# Patient Record
Sex: Female | Born: 1935 | Race: White | Hispanic: No | Smoking: Former smoker
Health system: Southern US, Community
[De-identification: ages and names within clinical notes are randomized; demographics above are authoritative.]

## PROBLEM LIST (undated history)

## (undated) DIAGNOSIS — M109 Gout, unspecified: Secondary | ICD-10-CM

## (undated) DIAGNOSIS — E785 Hyperlipidemia, unspecified: Secondary | ICD-10-CM

## (undated) DIAGNOSIS — I10 Essential (primary) hypertension: Secondary | ICD-10-CM

## (undated) DIAGNOSIS — R739 Hyperglycemia, unspecified: Secondary | ICD-10-CM

## (undated) HISTORY — DX: Essential (primary) hypertension: I10

## (undated) HISTORY — DX: Hyperlipidemia, unspecified: E78.5

## (undated) HISTORY — PX: TONSILLECTOMY AND ADENOIDECTOMY: SUR1326

## (undated) HISTORY — DX: Gout, unspecified: M10.9

## (undated) HISTORY — DX: Hyperglycemia, unspecified: R73.9

---

## 1976-09-29 HISTORY — PX: APPENDECTOMY: SHX54

## 1976-09-29 HISTORY — PX: TOTAL ABDOMINAL HYSTERECTOMY: SHX209

## 1994-09-29 HISTORY — PX: COLECTOMY: SHX59

## 2009-09-17 HISTORY — PX: COLONOSCOPY: SHX174

## 2011-02-28 DIAGNOSIS — I10 Essential (primary) hypertension: Secondary | ICD-10-CM | POA: Insufficient documentation

## 2011-02-28 DIAGNOSIS — M109 Gout, unspecified: Secondary | ICD-10-CM | POA: Insufficient documentation

## 2011-02-28 DIAGNOSIS — E7800 Pure hypercholesterolemia, unspecified: Secondary | ICD-10-CM | POA: Insufficient documentation

## 2011-07-14 DIAGNOSIS — E079 Disorder of thyroid, unspecified: Secondary | ICD-10-CM | POA: Insufficient documentation

## 2011-07-14 DIAGNOSIS — C19 Malignant neoplasm of rectosigmoid junction: Secondary | ICD-10-CM | POA: Insufficient documentation

## 2011-08-04 HISTORY — PX: COLONOSCOPY: SHX174

## 2013-02-01 HISTORY — PX: KNEE SURGERY: SHX244

## 2014-11-07 DIAGNOSIS — J209 Acute bronchitis, unspecified: Secondary | ICD-10-CM | POA: Diagnosis not present

## 2014-12-02 DIAGNOSIS — H81399 Other peripheral vertigo, unspecified ear: Secondary | ICD-10-CM | POA: Diagnosis not present

## 2014-12-25 DIAGNOSIS — E782 Mixed hyperlipidemia: Secondary | ICD-10-CM | POA: Diagnosis not present

## 2014-12-25 DIAGNOSIS — E780 Pure hypercholesterolemia: Secondary | ICD-10-CM | POA: Diagnosis not present

## 2014-12-25 DIAGNOSIS — I10 Essential (primary) hypertension: Secondary | ICD-10-CM | POA: Diagnosis not present

## 2014-12-25 DIAGNOSIS — E039 Hypothyroidism, unspecified: Secondary | ICD-10-CM | POA: Diagnosis not present

## 2015-01-01 DIAGNOSIS — I10 Essential (primary) hypertension: Secondary | ICD-10-CM | POA: Diagnosis not present

## 2015-01-01 DIAGNOSIS — Z1389 Encounter for screening for other disorder: Secondary | ICD-10-CM | POA: Diagnosis not present

## 2015-01-01 DIAGNOSIS — R7301 Impaired fasting glucose: Secondary | ICD-10-CM | POA: Diagnosis not present

## 2015-01-01 DIAGNOSIS — N189 Chronic kidney disease, unspecified: Secondary | ICD-10-CM | POA: Diagnosis not present

## 2015-01-01 DIAGNOSIS — E039 Hypothyroidism, unspecified: Secondary | ICD-10-CM | POA: Diagnosis not present

## 2015-01-01 DIAGNOSIS — E782 Mixed hyperlipidemia: Secondary | ICD-10-CM | POA: Diagnosis not present

## 2015-04-20 DIAGNOSIS — N3000 Acute cystitis without hematuria: Secondary | ICD-10-CM | POA: Diagnosis not present

## 2015-05-02 DIAGNOSIS — R300 Dysuria: Secondary | ICD-10-CM | POA: Diagnosis not present

## 2015-06-15 DIAGNOSIS — J209 Acute bronchitis, unspecified: Secondary | ICD-10-CM | POA: Diagnosis not present

## 2015-06-20 DIAGNOSIS — N3000 Acute cystitis without hematuria: Secondary | ICD-10-CM | POA: Diagnosis not present

## 2015-06-26 DIAGNOSIS — E785 Hyperlipidemia, unspecified: Secondary | ICD-10-CM | POA: Diagnosis not present

## 2015-06-26 DIAGNOSIS — E780 Pure hypercholesterolemia: Secondary | ICD-10-CM | POA: Diagnosis not present

## 2015-06-26 DIAGNOSIS — I10 Essential (primary) hypertension: Secondary | ICD-10-CM | POA: Diagnosis not present

## 2015-06-26 DIAGNOSIS — E039 Hypothyroidism, unspecified: Secondary | ICD-10-CM | POA: Diagnosis not present

## 2015-06-27 DIAGNOSIS — Z961 Presence of intraocular lens: Secondary | ICD-10-CM | POA: Diagnosis not present

## 2015-07-03 DIAGNOSIS — I10 Essential (primary) hypertension: Secondary | ICD-10-CM | POA: Diagnosis not present

## 2015-07-03 DIAGNOSIS — R7301 Impaired fasting glucose: Secondary | ICD-10-CM | POA: Diagnosis not present

## 2015-07-03 DIAGNOSIS — E782 Mixed hyperlipidemia: Secondary | ICD-10-CM | POA: Diagnosis not present

## 2015-07-03 DIAGNOSIS — N189 Chronic kidney disease, unspecified: Secondary | ICD-10-CM | POA: Diagnosis not present

## 2015-07-03 DIAGNOSIS — Z23 Encounter for immunization: Secondary | ICD-10-CM | POA: Diagnosis not present

## 2015-07-03 DIAGNOSIS — E039 Hypothyroidism, unspecified: Secondary | ICD-10-CM | POA: Diagnosis not present

## 2015-08-04 DIAGNOSIS — M25572 Pain in left ankle and joints of left foot: Secondary | ICD-10-CM | POA: Diagnosis not present

## 2015-08-04 DIAGNOSIS — J019 Acute sinusitis, unspecified: Secondary | ICD-10-CM | POA: Diagnosis not present

## 2015-08-04 DIAGNOSIS — R609 Edema, unspecified: Secondary | ICD-10-CM | POA: Diagnosis not present

## 2015-09-05 DIAGNOSIS — R300 Dysuria: Secondary | ICD-10-CM | POA: Diagnosis not present

## 2015-12-04 DIAGNOSIS — N3001 Acute cystitis with hematuria: Secondary | ICD-10-CM | POA: Diagnosis not present

## 2015-12-04 DIAGNOSIS — R05 Cough: Secondary | ICD-10-CM | POA: Diagnosis not present

## 2015-12-25 DIAGNOSIS — E782 Mixed hyperlipidemia: Secondary | ICD-10-CM | POA: Diagnosis not present

## 2015-12-25 DIAGNOSIS — E039 Hypothyroidism, unspecified: Secondary | ICD-10-CM | POA: Diagnosis not present

## 2015-12-25 DIAGNOSIS — I10 Essential (primary) hypertension: Secondary | ICD-10-CM | POA: Diagnosis not present

## 2015-12-25 DIAGNOSIS — M1990 Unspecified osteoarthritis, unspecified site: Secondary | ICD-10-CM | POA: Diagnosis not present

## 2015-12-25 DIAGNOSIS — N189 Chronic kidney disease, unspecified: Secondary | ICD-10-CM | POA: Diagnosis not present

## 2015-12-25 DIAGNOSIS — R7301 Impaired fasting glucose: Secondary | ICD-10-CM | POA: Diagnosis not present

## 2016-01-01 DIAGNOSIS — R7301 Impaired fasting glucose: Secondary | ICD-10-CM | POA: Diagnosis not present

## 2016-01-01 DIAGNOSIS — I10 Essential (primary) hypertension: Secondary | ICD-10-CM | POA: Diagnosis not present

## 2016-01-01 DIAGNOSIS — E782 Mixed hyperlipidemia: Secondary | ICD-10-CM | POA: Diagnosis not present

## 2016-01-01 DIAGNOSIS — E039 Hypothyroidism, unspecified: Secondary | ICD-10-CM | POA: Diagnosis not present

## 2016-01-01 DIAGNOSIS — N189 Chronic kidney disease, unspecified: Secondary | ICD-10-CM | POA: Diagnosis not present

## 2016-01-07 DIAGNOSIS — N3090 Cystitis, unspecified without hematuria: Secondary | ICD-10-CM | POA: Insufficient documentation

## 2016-01-08 DIAGNOSIS — L820 Inflamed seborrheic keratosis: Secondary | ICD-10-CM | POA: Diagnosis not present

## 2016-01-08 DIAGNOSIS — N3090 Cystitis, unspecified without hematuria: Secondary | ICD-10-CM | POA: Diagnosis not present

## 2016-01-08 DIAGNOSIS — D485 Neoplasm of uncertain behavior of skin: Secondary | ICD-10-CM | POA: Diagnosis not present

## 2016-01-08 DIAGNOSIS — L821 Other seborrheic keratosis: Secondary | ICD-10-CM | POA: Diagnosis not present

## 2016-03-29 DIAGNOSIS — J0101 Acute recurrent maxillary sinusitis: Secondary | ICD-10-CM | POA: Diagnosis not present

## 2016-03-29 DIAGNOSIS — S46811A Strain of other muscles, fascia and tendons at shoulder and upper arm level, right arm, initial encounter: Secondary | ICD-10-CM | POA: Diagnosis not present

## 2016-03-29 DIAGNOSIS — J301 Allergic rhinitis due to pollen: Secondary | ICD-10-CM | POA: Diagnosis not present

## 2016-07-10 DIAGNOSIS — E782 Mixed hyperlipidemia: Secondary | ICD-10-CM | POA: Diagnosis not present

## 2016-07-10 DIAGNOSIS — N189 Chronic kidney disease, unspecified: Secondary | ICD-10-CM | POA: Diagnosis not present

## 2016-07-10 DIAGNOSIS — M1990 Unspecified osteoarthritis, unspecified site: Secondary | ICD-10-CM | POA: Diagnosis not present

## 2016-07-10 DIAGNOSIS — E039 Hypothyroidism, unspecified: Secondary | ICD-10-CM | POA: Diagnosis not present

## 2016-07-10 DIAGNOSIS — I10 Essential (primary) hypertension: Secondary | ICD-10-CM | POA: Diagnosis not present

## 2016-07-15 DIAGNOSIS — I10 Essential (primary) hypertension: Secondary | ICD-10-CM | POA: Diagnosis not present

## 2016-07-15 DIAGNOSIS — R7301 Impaired fasting glucose: Secondary | ICD-10-CM | POA: Diagnosis not present

## 2016-07-15 DIAGNOSIS — Z23 Encounter for immunization: Secondary | ICD-10-CM | POA: Diagnosis not present

## 2016-07-15 DIAGNOSIS — E039 Hypothyroidism, unspecified: Secondary | ICD-10-CM | POA: Diagnosis not present

## 2016-07-15 DIAGNOSIS — E782 Mixed hyperlipidemia: Secondary | ICD-10-CM | POA: Diagnosis not present

## 2016-07-15 DIAGNOSIS — N189 Chronic kidney disease, unspecified: Secondary | ICD-10-CM | POA: Diagnosis not present

## 2016-10-13 DIAGNOSIS — H524 Presbyopia: Secondary | ICD-10-CM | POA: Diagnosis not present

## 2016-10-13 DIAGNOSIS — H43393 Other vitreous opacities, bilateral: Secondary | ICD-10-CM | POA: Diagnosis not present

## 2017-01-08 DIAGNOSIS — E782 Mixed hyperlipidemia: Secondary | ICD-10-CM | POA: Diagnosis not present

## 2017-01-08 DIAGNOSIS — E7800 Pure hypercholesterolemia, unspecified: Secondary | ICD-10-CM | POA: Diagnosis not present

## 2017-01-08 DIAGNOSIS — N189 Chronic kidney disease, unspecified: Secondary | ICD-10-CM | POA: Diagnosis not present

## 2017-01-08 DIAGNOSIS — I10 Essential (primary) hypertension: Secondary | ICD-10-CM | POA: Diagnosis not present

## 2017-01-08 DIAGNOSIS — E039 Hypothyroidism, unspecified: Secondary | ICD-10-CM | POA: Diagnosis not present

## 2017-01-15 DIAGNOSIS — R7301 Impaired fasting glucose: Secondary | ICD-10-CM | POA: Diagnosis not present

## 2017-01-15 DIAGNOSIS — Z0001 Encounter for general adult medical examination with abnormal findings: Secondary | ICD-10-CM | POA: Diagnosis not present

## 2017-04-20 DIAGNOSIS — R300 Dysuria: Secondary | ICD-10-CM | POA: Diagnosis not present

## 2017-04-20 DIAGNOSIS — R109 Unspecified abdominal pain: Secondary | ICD-10-CM | POA: Diagnosis not present

## 2017-04-20 DIAGNOSIS — R350 Frequency of micturition: Secondary | ICD-10-CM | POA: Diagnosis not present

## 2017-04-25 DIAGNOSIS — R300 Dysuria: Secondary | ICD-10-CM | POA: Diagnosis not present

## 2017-05-26 ENCOUNTER — Ambulatory Visit (INDEPENDENT_AMBULATORY_CARE_PROVIDER_SITE_OTHER): Payer: Medicare Other | Admitting: Urology

## 2017-05-26 ENCOUNTER — Other Ambulatory Visit (HOSPITAL_COMMUNITY)
Admission: RE | Admit: 2017-05-26 | Discharge: 2017-05-26 | Disposition: A | Discharge status: Home / Self Care | Payer: Medicare Other | Source: Other Acute Inpatient Hospital | Attending: Urology | Admitting: Urology

## 2017-05-26 DIAGNOSIS — R8271 Bacteriuria: Secondary | ICD-10-CM

## 2017-05-26 DIAGNOSIS — N952 Postmenopausal atrophic vaginitis: Secondary | ICD-10-CM | POA: Diagnosis not present

## 2017-05-26 DIAGNOSIS — N3000 Acute cystitis without hematuria: Secondary | ICD-10-CM | POA: Diagnosis not present

## 2017-05-27 LAB — URINE CULTURE

## 2017-06-30 ENCOUNTER — Ambulatory Visit (INDEPENDENT_AMBULATORY_CARE_PROVIDER_SITE_OTHER): Payer: Medicare Other | Admitting: Urology

## 2017-06-30 DIAGNOSIS — R3915 Urgency of urination: Secondary | ICD-10-CM | POA: Diagnosis not present

## 2017-06-30 DIAGNOSIS — N3000 Acute cystitis without hematuria: Secondary | ICD-10-CM

## 2017-06-30 DIAGNOSIS — R8271 Bacteriuria: Secondary | ICD-10-CM | POA: Diagnosis not present

## 2017-06-30 DIAGNOSIS — N952 Postmenopausal atrophic vaginitis: Secondary | ICD-10-CM | POA: Diagnosis not present

## 2017-07-08 DIAGNOSIS — E782 Mixed hyperlipidemia: Secondary | ICD-10-CM | POA: Diagnosis not present

## 2017-07-08 DIAGNOSIS — E039 Hypothyroidism, unspecified: Secondary | ICD-10-CM | POA: Diagnosis not present

## 2017-07-08 DIAGNOSIS — E7800 Pure hypercholesterolemia, unspecified: Secondary | ICD-10-CM | POA: Diagnosis not present

## 2017-07-08 DIAGNOSIS — I10 Essential (primary) hypertension: Secondary | ICD-10-CM | POA: Diagnosis not present

## 2017-07-08 DIAGNOSIS — D485 Neoplasm of uncertain behavior of skin: Secondary | ICD-10-CM | POA: Diagnosis not present

## 2017-07-16 DIAGNOSIS — R7301 Impaired fasting glucose: Secondary | ICD-10-CM | POA: Diagnosis not present

## 2017-07-16 DIAGNOSIS — Z23 Encounter for immunization: Secondary | ICD-10-CM | POA: Diagnosis not present

## 2017-07-16 DIAGNOSIS — I10 Essential (primary) hypertension: Secondary | ICD-10-CM | POA: Diagnosis not present

## 2017-07-16 DIAGNOSIS — E782 Mixed hyperlipidemia: Secondary | ICD-10-CM | POA: Diagnosis not present

## 2017-07-16 DIAGNOSIS — R350 Frequency of micturition: Secondary | ICD-10-CM | POA: Diagnosis not present

## 2017-07-16 DIAGNOSIS — N189 Chronic kidney disease, unspecified: Secondary | ICD-10-CM | POA: Diagnosis not present

## 2017-07-16 DIAGNOSIS — E039 Hypothyroidism, unspecified: Secondary | ICD-10-CM | POA: Diagnosis not present

## 2017-08-05 DIAGNOSIS — E7800 Pure hypercholesterolemia, unspecified: Secondary | ICD-10-CM | POA: Diagnosis not present

## 2017-08-05 DIAGNOSIS — I10 Essential (primary) hypertension: Secondary | ICD-10-CM | POA: Diagnosis not present

## 2017-08-05 DIAGNOSIS — E039 Hypothyroidism, unspecified: Secondary | ICD-10-CM | POA: Diagnosis not present

## 2017-08-05 DIAGNOSIS — I4892 Unspecified atrial flutter: Secondary | ICD-10-CM | POA: Diagnosis not present

## 2017-08-05 DIAGNOSIS — I4510 Unspecified right bundle-branch block: Secondary | ICD-10-CM | POA: Diagnosis not present

## 2017-08-05 DIAGNOSIS — Z66 Do not resuscitate: Secondary | ICD-10-CM | POA: Diagnosis not present

## 2017-08-05 DIAGNOSIS — R0789 Other chest pain: Secondary | ICD-10-CM | POA: Diagnosis not present

## 2017-08-05 DIAGNOSIS — M109 Gout, unspecified: Secondary | ICD-10-CM | POA: Diagnosis not present

## 2017-08-05 DIAGNOSIS — Z87891 Personal history of nicotine dependence: Secondary | ICD-10-CM | POA: Diagnosis not present

## 2017-08-05 DIAGNOSIS — Z79899 Other long term (current) drug therapy: Secondary | ICD-10-CM | POA: Diagnosis not present

## 2017-08-05 DIAGNOSIS — Z780 Asymptomatic menopausal state: Secondary | ICD-10-CM | POA: Diagnosis not present

## 2017-08-05 DIAGNOSIS — E119 Type 2 diabetes mellitus without complications: Secondary | ICD-10-CM | POA: Diagnosis not present

## 2017-08-05 DIAGNOSIS — R0602 Shortness of breath: Secondary | ICD-10-CM | POA: Diagnosis not present

## 2017-08-05 DIAGNOSIS — Z85038 Personal history of other malignant neoplasm of large intestine: Secondary | ICD-10-CM | POA: Diagnosis not present

## 2017-08-05 DIAGNOSIS — N3289 Other specified disorders of bladder: Secondary | ICD-10-CM | POA: Diagnosis not present

## 2017-08-05 DIAGNOSIS — R0600 Dyspnea, unspecified: Secondary | ICD-10-CM | POA: Diagnosis not present

## 2017-08-05 DIAGNOSIS — R05 Cough: Secondary | ICD-10-CM | POA: Diagnosis not present

## 2017-08-05 DIAGNOSIS — R079 Chest pain, unspecified: Secondary | ICD-10-CM | POA: Diagnosis not present

## 2017-08-06 DIAGNOSIS — E039 Hypothyroidism, unspecified: Secondary | ICD-10-CM | POA: Diagnosis not present

## 2017-08-06 DIAGNOSIS — I10 Essential (primary) hypertension: Secondary | ICD-10-CM | POA: Diagnosis not present

## 2017-08-06 DIAGNOSIS — E7800 Pure hypercholesterolemia, unspecified: Secondary | ICD-10-CM | POA: Diagnosis not present

## 2017-08-06 DIAGNOSIS — R079 Chest pain, unspecified: Secondary | ICD-10-CM | POA: Diagnosis not present

## 2017-08-25 ENCOUNTER — Other Ambulatory Visit: Payer: Self-pay

## 2017-08-25 ENCOUNTER — Encounter: Payer: Self-pay | Admitting: *Deleted

## 2017-08-25 ENCOUNTER — Encounter: Payer: Self-pay | Admitting: Cardiovascular Disease

## 2017-08-25 ENCOUNTER — Ambulatory Visit: Payer: Medicare Other | Admitting: Cardiovascular Disease

## 2017-08-25 ENCOUNTER — Telehealth: Payer: Self-pay | Admitting: Cardiovascular Disease

## 2017-08-25 VITALS — BP 170/90 | HR 101 | Ht 62.0 in | Wt 161.0 lb

## 2017-08-25 DIAGNOSIS — R079 Chest pain, unspecified: Secondary | ICD-10-CM | POA: Diagnosis not present

## 2017-08-25 DIAGNOSIS — I10 Essential (primary) hypertension: Secondary | ICD-10-CM

## 2017-08-25 DIAGNOSIS — Z9289 Personal history of other medical treatment: Secondary | ICD-10-CM | POA: Diagnosis not present

## 2017-08-25 DIAGNOSIS — E785 Hyperlipidemia, unspecified: Secondary | ICD-10-CM

## 2017-08-25 DIAGNOSIS — R5383 Other fatigue: Secondary | ICD-10-CM

## 2017-08-25 NOTE — Telephone Encounter (Signed)
Pre-cert Verification for the following procedure    lexiscan myoview scheduled for 08-27-17  [facility]

## 2017-08-25 NOTE — Progress Notes (Signed)
CARDIOLOGY CONSULT NOTE  Patient ID: Courtney Oliver MRN: [id number] DOB/AGE: 1927-02-12 83 y.o.  Admit date: (Not on file) Primary Physician: Manon Hilding, MD Referring Physician: Dr. Quintin Alto  Reason for Consultation: Chest pain  HPI: Courtney Oliver is a 83 y.o. female who is being seen today for the evaluation of chest pain at the request of Sasser, Silvestre Moment, MD.   Past medical history includes hypertension, hyperlipidemia, and gout.  Echocardiogram performed on 08/06/17 at an outside facility was reportedly a technically difficult study but demonstrated normal left ventricular systolic function, LVEF 32-35%, and no significant valvular disease.  Lipids 07/08/17: Total cholesterol 140, triglycerides 207, HDL 44, LDL 55.  Additional labs include BUN 22, creatinine 0.99, sodium 139, potassium 4.4, TSH 2.75, uric acid 5.2.  ECG performed today which I personally interpreted demonstrated sinus rhythm with a right bundle branch block, 99 bpm.  She is here with her daughter-in-law.  She was apparently hospitalized for chest pain at [facility] on November 7.  I do not have a copy of the discharge summary but will request further documentation.  She said she was awoken by chest pain which got better as she sat up.  She was also short of breath.  She denies orthopnea and paroxysmal nocturnal dyspnea.  She has occasional left ankle swelling.  She currently denies exertional chest pain.  Her daughter-in-law notes that her mother-in-law has had increasing levels of fatigue.  When they were recently when shopping, she was sitting Oliver more frequently than she had been in the past 6-12 months.  She did not take her blood pressure medication today and it is markedly elevated, 170/90.  She said her systolic blood pressures are usually normal.  Social history: She is originally from [place] and lived there for 47 years.  She moved to [place] about 11 years ago to be close to  family.        No Known Allergies  Current Outpatient Medications  Medication Sig Dispense Refill  . allopurinol (ZYLOPRIM) 300 MG tablet     . atorvastatin (LIPITOR) 40 MG tablet 20 mg.     . losartan-hydrochlorothiazide (HYZAAR) 100-25 MG tablet losartan 100 mg-hydrochlorothiazide 25 mg tablet    . nitroGLYCERIN (NITROSTAT) 0.4 MG SL tablet     . oxybutynin (DITROPAN-XL) 10 MG 24 hr tablet Take 10 mg by mouth at bedtime.     No current facility-administered medications for this visit.     Past Medical History:  Diagnosis Date  . Gout   . Hyperglycemia   . Hyperlipidemia   . Hypertension     Past Surgical History:  Procedure Laterality Date  . APPENDECTOMY  1978  . COLECTOMY  1996  . COLONOSCOPY  09/17/2009   adenomatous polyp  . COLONOSCOPY  08/04/2011   tubular adenoma  . KNEE SURGERY  02/01/2013  . TONSILLECTOMY AND ADENOIDECTOMY    . TOTAL ABDOMINAL HYSTERECTOMY  1978    Social History   Socioeconomic History  . Marital status: Widowed    Spouse name: Not on file  . Number of children: Not on file  . Years of education: Not on file  . Highest education level: Not on file  Social Needs  . Financial resource strain: Not on file  . Food insecurity - worry: Not on file  . Food insecurity - inability: Not on file  . Transportation needs - medical: Not on file  . Transportation needs - non-medical: Not  on file  Occupational History  . Not on file  Tobacco Use  . Smoking status: Former Smoker    Types: Cigarettes  . Smokeless tobacco: Never Used  Substance and Sexual Activity  . Alcohol use: Not on file  . Drug use: Not on file  . Sexual activity: Not on file  Other Topics Concern  . Not on file  Social History Narrative  . Not on file     No family history of premature CAD in 1st degree relatives.  Current Meds  Medication Sig  . allopurinol (ZYLOPRIM) 300 MG tablet   . atorvastatin (LIPITOR) 40 MG tablet 20 mg.   .  losartan-hydrochlorothiazide (HYZAAR) 100-25 MG tablet losartan 100 mg-hydrochlorothiazide 25 mg tablet  . nitroGLYCERIN (NITROSTAT) 0.4 MG SL tablet   . oxybutynin (DITROPAN-XL) 10 MG 24 hr tablet Take 10 mg by mouth at bedtime.      Review of systems complete and found to be negative unless listed above in HPI    Physical exam Blood pressure (!) 170/90, pulse (!) 101, height 5\' 2"  (1.575 m), weight 161 lb (73 kg), SpO2 96 %. General: NAD Neck: No JVD, no thyromegaly or thyroid nodule.  Lungs: Clear to auscultation bilaterally with normal respiratory effort. CV: Nondisplaced PMI. Regular rate and rhythm, normal S1/split S2, no S3/S4, no murmur.  No peripheral edema.  No carotid bruit.    Abdomen: Soft, nontender, no distention.  Skin: Intact without lesions or rashes.  Neurologic: Alert and oriented x 3.  Psych: Normal affect. Extremities: No clubbing or cyanosis.  HEENT: Normal.   ECG: Most recent ECG reviewed.   Labs: No results found for: K, BUN, CREATININE, ALT, TSH, HGB   Lipids: No results found for: LDLCALC, LDLDIRECT, CHOL, TRIG, HDL      ASSESSMENT AND PLAN:  1.  Chest pain and fatigue: Risk factors for ischemic heart disease include age, hypertension, and hyperlipidemia. I will proceed with a nuclear myocardial perfusion imaging study to evaluate for ischemic heart disease (Lexiscan Myoview).  2.  Hypertension: Elevated today.  This will need additional monitoring to see if further antihypertensive titration as indicated.  3.  Hyperlipidemia: Lipids reviewed above.  Continue Lipitor 40 mg.   Disposition: Follow up in 6 weeks  Signed: Kate Sable, M.D., F.A.C.C.  08/25/2017, 8:43 AM

## 2017-08-25 NOTE — Patient Instructions (Signed)
Medication Instructions:  Continue all current medications.  Labwork: none  Testing/Procedures:  Your physician has requested that you have a lexiscan myoview. For further information please visit [email or web address]. Please follow instruction sheet, as given.  Office will contact with results via phone or letter.    Follow-Up: 6 weeks   Any Other Special Instructions Will Be Listed Below (If Applicable).  If you need a refill on your cardiac medications before your next appointment, please call your pharmacy.  

## 2017-08-28 ENCOUNTER — Encounter (HOSPITAL_COMMUNITY)
Admission: RE | Admit: 2017-08-28 | Discharge: 2017-08-28 | Disposition: A | Discharge status: Home / Self Care | Payer: Medicare Other | Source: Ambulatory Visit | Attending: Cardiovascular Disease | Admitting: Cardiovascular Disease

## 2017-08-28 ENCOUNTER — Encounter (HOSPITAL_COMMUNITY): Payer: Self-pay

## 2017-08-28 ENCOUNTER — Encounter (HOSPITAL_BASED_OUTPATIENT_CLINIC_OR_DEPARTMENT_OTHER)
Admission: RE | Admit: 2017-08-28 | Discharge: 2017-08-28 | Disposition: A | Discharge status: Home / Self Care | Payer: Medicare Other | Source: Ambulatory Visit | Attending: Cardiovascular Disease | Admitting: Cardiovascular Disease

## 2017-08-28 DIAGNOSIS — R079 Chest pain, unspecified: Secondary | ICD-10-CM | POA: Diagnosis not present

## 2017-08-28 LAB — NM MYOCAR MULTI W/SPECT W/WALL MOTION / EF
CHL CUP NUCLEAR SDS: 1
CHL CUP NUCLEAR SRS: 0
CHL CUP NUCLEAR SSS: 1
CHL CUP RESTING HR STRESS: 101 {beats}/min
LHR: 0.39
LV dias vol: 33 mL (ref 46–106)
LV sys vol: 3 mL
NUC STRESS TID: 0.87
Peak HR: 129 {beats}/min

## 2017-08-28 MED ORDER — TECHNETIUM TC 99M TETROFOSMIN IV KIT
30.0000 | PACK | Freq: Once | INTRAVENOUS | Status: AC | PRN
  Administered 2017-08-28: 30 via INTRAVENOUS

## 2017-08-28 MED ORDER — SODIUM CHLORIDE 0.9% FLUSH
INTRAVENOUS | Status: AC
Start: 2017-08-28 — End: 2017-08-28
  Administered 2017-08-28: 10 mL via INTRAVENOUS
  Filled 2017-08-28: qty 10

## 2017-08-28 MED ORDER — TECHNETIUM TC 99M TETROFOSMIN IV KIT
10.0000 | PACK | Freq: Once | INTRAVENOUS | Status: AC | PRN
  Administered 2017-08-28: 10 via INTRAVENOUS

## 2017-08-28 MED ORDER — REGADENOSON 0.4 MG/5ML IV SOLN
INTRAVENOUS | Status: AC
  Administered 2017-08-28: 0.4 mg via INTRAVENOUS
  Filled 2017-08-28: qty 5

## 2017-09-01 ENCOUNTER — Encounter: Payer: Self-pay | Admitting: *Deleted

## 2017-09-05 DIAGNOSIS — I4510 Unspecified right bundle-branch block: Secondary | ICD-10-CM | POA: Diagnosis not present

## 2017-09-05 DIAGNOSIS — E782 Mixed hyperlipidemia: Secondary | ICD-10-CM | POA: Diagnosis not present

## 2017-09-05 DIAGNOSIS — R072 Precordial pain: Secondary | ICD-10-CM | POA: Diagnosis not present

## 2017-09-05 DIAGNOSIS — I10 Essential (primary) hypertension: Secondary | ICD-10-CM | POA: Diagnosis not present

## 2017-09-09 ENCOUNTER — Telehealth: Payer: Self-pay | Admitting: Cardiovascular Disease

## 2017-09-09 NOTE — Telephone Encounter (Signed)
Notes recorded by Laurine Blazer, LPN on 51/0/2585 at 2:77 PM EST Patient notified of results by letter. Copy to pmd.   ------  Notes recorded by Herminio Commons, MD on 08/28/2017 at 4:06 PM EST Normal.    Patient notified of above.

## 2017-09-09 NOTE — Telephone Encounter (Signed)
Patient is calling requesting test results of recent stress test.  Please call [phone].

## 2017-09-15 ENCOUNTER — Ambulatory Visit: Payer: Medicare Other | Admitting: Urology

## 2017-09-15 DIAGNOSIS — N3281 Overactive bladder: Secondary | ICD-10-CM

## 2017-09-15 DIAGNOSIS — R3915 Urgency of urination: Secondary | ICD-10-CM

## 2017-09-15 DIAGNOSIS — R351 Nocturia: Secondary | ICD-10-CM | POA: Diagnosis not present

## 2017-10-07 ENCOUNTER — Encounter: Payer: Self-pay | Admitting: Cardiovascular Disease

## 2017-10-07 ENCOUNTER — Ambulatory Visit: Payer: Medicare Other | Admitting: Cardiovascular Disease

## 2017-10-07 VITALS — BP 140/78 | HR 106 | Ht 62.0 in | Wt 162.0 lb

## 2017-10-07 DIAGNOSIS — E785 Hyperlipidemia, unspecified: Secondary | ICD-10-CM

## 2017-10-07 DIAGNOSIS — J209 Acute bronchitis, unspecified: Secondary | ICD-10-CM | POA: Diagnosis not present

## 2017-10-07 DIAGNOSIS — R5383 Other fatigue: Secondary | ICD-10-CM | POA: Diagnosis not present

## 2017-10-07 DIAGNOSIS — I10 Essential (primary) hypertension: Secondary | ICD-10-CM | POA: Diagnosis not present

## 2017-10-07 DIAGNOSIS — R079 Chest pain, unspecified: Secondary | ICD-10-CM | POA: Diagnosis not present

## 2017-10-07 NOTE — Patient Instructions (Signed)
Medication Instructions:  Your physician recommends that you continue on your current medications as directed. Please refer to the Current Medication list given to you today.  Labwork: NONE  Testing/Procedures: NONE  Follow-Up: Your physician recommends that you schedule a follow-up appointment AS NEEDED WITH DR. KONESWARAN  Any Other Special Instructions Will Be Listed Below (If Applicable).  If you need a refill on your cardiac medications before your next appointment, please call your pharmacy. 

## 2017-10-07 NOTE — Progress Notes (Signed)
SUBJECTIVE: The patient returns for follow-up after undergoing cardiovascular testing performed for the evaluation of chest pain.  Nuclear stress test 08/28/17 was normal with no evidence of myocardial ischemia or scar, LVEF 92%.  Since her last visit with me she had one episode of chest pain alleviated with nitroglycerin.  It occurred while she was sleeping.  She denies any exertional symptoms of chest pain or shortness of breath.  She has been feeling well overall.   Review of Systems: As per "subjective", otherwise negative.  No Known Allergies  Current Outpatient Medications  Medication Sig Dispense Refill  . allopurinol (ZYLOPRIM) 300 MG tablet     . atorvastatin (LIPITOR) 40 MG tablet 20 mg.     . losartan-hydrochlorothiazide (HYZAAR) 100-25 MG tablet losartan 100 mg-hydrochlorothiazide 25 mg tablet    . nitroGLYCERIN (NITROSTAT) 0.4 MG SL tablet      No current facility-administered medications for this visit.     Past Medical History:  Diagnosis Date  . Gout   . Hyperglycemia   . Hyperlipidemia   . Hypertension     Past Surgical History:  Procedure Laterality Date  . APPENDECTOMY  1978  . COLECTOMY  1996  . COLONOSCOPY  09/17/2009   adenomatous polyp  . COLONOSCOPY  08/04/2011   tubular adenoma  . KNEE SURGERY  02/01/2013  . TONSILLECTOMY AND ADENOIDECTOMY    . TOTAL ABDOMINAL HYSTERECTOMY  1978    Social History   Socioeconomic History  . Marital status: Widowed    Spouse name: Not on file  . Number of children: Not on file  . Years of education: Not on file  . Highest education level: Not on file  Social Needs  . Financial resource strain: Not on file  . Food insecurity - worry: Not on file  . Food insecurity - inability: Not on file  . Transportation needs - medical: Not on file  . Transportation needs - non-medical: Not on file  Occupational History  . Not on file  Tobacco Use  . Smoking status: Former Smoker    Types: Cigarettes  .  Smokeless tobacco: Never Used  Substance and Sexual Activity  . Alcohol use: Not on file  . Drug use: Not on file  . Sexual activity: Not on file  Other Topics Concern  . Not on file  Social History Narrative  . Not on file     Vitals:   10/07/17 1029  BP: 140/78  Pulse: (!) 106  SpO2: 98%  Weight: 162 lb (73.5 kg)  Height: 5\' 2"  (1.575 m)    Wt Readings from Last 3 Encounters:  10/07/17 162 lb (73.5 kg)  08/25/17 161 lb (73 kg)     PHYSICAL EXAM General: NAD HEENT: Normal. Neck: No JVD, no thyromegaly. Lungs: Clear to auscultation bilaterally with normal respiratory effort. CV: Initially mildly tachycardic but regular, HR subsequently normalized, normal S1/S2, no S3/S4, no murmur. No pretibial or periankle edema.   Abdomen: Soft, nontender, no distention.  Neurologic: Alert and oriented.  Psych: Normal affect. Skin: Normal. Musculoskeletal: No gross deformities.    ECG: Most recent ECG reviewed.   Labs: No results found for: K, BUN, CREATININE, ALT, TSH, HGB   Lipids: No results found for: LDLCALC, LDLDIRECT, CHOL, TRIG, HDL     ASSESSMENT AND PLAN: 1.  Chest pain and fatigue: Nuclear stress test was normal as detailed above.  No further cardiac testing is indicated at this time.  If symptoms were to recur  I told her to inform me.  2.  Hypertension: Mildly elevated today.  This will need additional monitoring to see if further antihypertensive titration as indicated.  3.  Hyperlipidemia: Lipids previously reviewed.  Continue Lipitor 40 mg.      Disposition: Follow up as needed   Kate Sable, M.D., F.A.C.C.

## 2017-10-14 DIAGNOSIS — H524 Presbyopia: Secondary | ICD-10-CM | POA: Diagnosis not present

## 2017-10-14 DIAGNOSIS — H43813 Vitreous degeneration, bilateral: Secondary | ICD-10-CM | POA: Diagnosis not present

## 2017-10-20 DIAGNOSIS — N3001 Acute cystitis with hematuria: Secondary | ICD-10-CM | POA: Diagnosis not present

## 2017-11-16 DIAGNOSIS — R300 Dysuria: Secondary | ICD-10-CM | POA: Diagnosis not present

## 2018-01-14 DIAGNOSIS — E7800 Pure hypercholesterolemia, unspecified: Secondary | ICD-10-CM | POA: Diagnosis not present

## 2018-01-14 DIAGNOSIS — E039 Hypothyroidism, unspecified: Secondary | ICD-10-CM | POA: Diagnosis not present

## 2018-01-14 DIAGNOSIS — R7301 Impaired fasting glucose: Secondary | ICD-10-CM | POA: Diagnosis not present

## 2018-01-14 DIAGNOSIS — E782 Mixed hyperlipidemia: Secondary | ICD-10-CM | POA: Diagnosis not present

## 2018-01-14 DIAGNOSIS — I10 Essential (primary) hypertension: Secondary | ICD-10-CM | POA: Diagnosis not present

## 2018-01-20 DIAGNOSIS — Z1331 Encounter for screening for depression: Secondary | ICD-10-CM | POA: Diagnosis not present

## 2018-01-20 DIAGNOSIS — E782 Mixed hyperlipidemia: Secondary | ICD-10-CM | POA: Diagnosis not present

## 2018-01-20 DIAGNOSIS — Z1389 Encounter for screening for other disorder: Secondary | ICD-10-CM | POA: Diagnosis not present

## 2018-01-20 DIAGNOSIS — E039 Hypothyroidism, unspecified: Secondary | ICD-10-CM | POA: Diagnosis not present

## 2018-01-20 DIAGNOSIS — R7301 Impaired fasting glucose: Secondary | ICD-10-CM | POA: Diagnosis not present

## 2018-01-20 DIAGNOSIS — Z0001 Encounter for general adult medical examination with abnormal findings: Secondary | ICD-10-CM | POA: Diagnosis not present

## 2018-04-19 DIAGNOSIS — J301 Allergic rhinitis due to pollen: Secondary | ICD-10-CM | POA: Diagnosis not present

## 2018-04-19 DIAGNOSIS — N3000 Acute cystitis without hematuria: Secondary | ICD-10-CM | POA: Diagnosis not present

## 2018-07-20 DIAGNOSIS — R739 Hyperglycemia, unspecified: Secondary | ICD-10-CM | POA: Diagnosis not present

## 2018-07-20 DIAGNOSIS — E039 Hypothyroidism, unspecified: Secondary | ICD-10-CM | POA: Diagnosis not present

## 2018-07-20 DIAGNOSIS — E7800 Pure hypercholesterolemia, unspecified: Secondary | ICD-10-CM | POA: Diagnosis not present

## 2018-07-20 DIAGNOSIS — I10 Essential (primary) hypertension: Secondary | ICD-10-CM | POA: Diagnosis not present

## 2018-07-20 DIAGNOSIS — E782 Mixed hyperlipidemia: Secondary | ICD-10-CM | POA: Diagnosis not present

## 2018-07-22 DIAGNOSIS — E039 Hypothyroidism, unspecified: Secondary | ICD-10-CM | POA: Diagnosis not present

## 2018-07-22 DIAGNOSIS — R350 Frequency of micturition: Secondary | ICD-10-CM | POA: Diagnosis not present

## 2018-07-22 DIAGNOSIS — E782 Mixed hyperlipidemia: Secondary | ICD-10-CM | POA: Diagnosis not present

## 2018-07-22 DIAGNOSIS — Z1389 Encounter for screening for other disorder: Secondary | ICD-10-CM | POA: Diagnosis not present

## 2018-07-22 DIAGNOSIS — I10 Essential (primary) hypertension: Secondary | ICD-10-CM | POA: Diagnosis not present

## 2018-07-22 DIAGNOSIS — N189 Chronic kidney disease, unspecified: Secondary | ICD-10-CM | POA: Diagnosis not present

## 2018-07-22 DIAGNOSIS — R7301 Impaired fasting glucose: Secondary | ICD-10-CM | POA: Diagnosis not present

## 2018-08-19 DIAGNOSIS — I889 Nonspecific lymphadenitis, unspecified: Secondary | ICD-10-CM | POA: Diagnosis not present

## 2018-09-09 DIAGNOSIS — D649 Anemia, unspecified: Secondary | ICD-10-CM | POA: Diagnosis not present

## 2018-09-09 DIAGNOSIS — I889 Nonspecific lymphadenitis, unspecified: Secondary | ICD-10-CM | POA: Diagnosis not present

## 2018-10-25 DIAGNOSIS — J209 Acute bronchitis, unspecified: Secondary | ICD-10-CM | POA: Diagnosis not present

## 2018-12-31 DIAGNOSIS — D649 Anemia, unspecified: Secondary | ICD-10-CM | POA: Diagnosis not present

## 2018-12-31 DIAGNOSIS — N189 Chronic kidney disease, unspecified: Secondary | ICD-10-CM | POA: Diagnosis not present

## 2019-01-04 DIAGNOSIS — I10 Essential (primary) hypertension: Secondary | ICD-10-CM | POA: Diagnosis not present

## 2019-01-04 DIAGNOSIS — R413 Other amnesia: Secondary | ICD-10-CM | POA: Diagnosis not present

## 2019-01-04 DIAGNOSIS — R7301 Impaired fasting glucose: Secondary | ICD-10-CM | POA: Diagnosis not present

## 2019-01-04 DIAGNOSIS — N189 Chronic kidney disease, unspecified: Secondary | ICD-10-CM | POA: Diagnosis not present

## 2019-01-04 DIAGNOSIS — E039 Hypothyroidism, unspecified: Secondary | ICD-10-CM | POA: Diagnosis not present

## 2019-02-04 ENCOUNTER — Other Ambulatory Visit: Payer: Self-pay

## 2019-02-04 NOTE — Patient Outreach (Signed)
[facility]) Care Management  02/04/2019  NALEYAH OHLINGER [birth year] [id number]   Medication Adherence call to Mrs. Olivia Lopez de Gutierrez Compliant Voice message left with a call back number. Mrs. Newhart is showing past due on Losartan 100 mg under [facility].  [place] Management Direct Dial [phone]  Fax [phone] [email or web address]

## 2019-06-30 DIAGNOSIS — E039 Hypothyroidism, unspecified: Secondary | ICD-10-CM | POA: Diagnosis not present

## 2019-06-30 DIAGNOSIS — E7800 Pure hypercholesterolemia, unspecified: Secondary | ICD-10-CM | POA: Diagnosis not present

## 2019-06-30 DIAGNOSIS — E782 Mixed hyperlipidemia: Secondary | ICD-10-CM | POA: Diagnosis not present

## 2019-06-30 DIAGNOSIS — I10 Essential (primary) hypertension: Secondary | ICD-10-CM | POA: Diagnosis not present

## 2019-06-30 DIAGNOSIS — R7301 Impaired fasting glucose: Secondary | ICD-10-CM | POA: Diagnosis not present

## 2019-07-04 DIAGNOSIS — Z23 Encounter for immunization: Secondary | ICD-10-CM | POA: Diagnosis not present

## 2019-07-04 DIAGNOSIS — I10 Essential (primary) hypertension: Secondary | ICD-10-CM | POA: Diagnosis not present

## 2019-07-04 DIAGNOSIS — R413 Other amnesia: Secondary | ICD-10-CM | POA: Diagnosis not present

## 2019-07-04 DIAGNOSIS — R7301 Impaired fasting glucose: Secondary | ICD-10-CM | POA: Diagnosis not present

## 2019-07-04 DIAGNOSIS — Z1389 Encounter for screening for other disorder: Secondary | ICD-10-CM | POA: Diagnosis not present

## 2019-07-25 DIAGNOSIS — N3090 Cystitis, unspecified without hematuria: Secondary | ICD-10-CM | POA: Diagnosis not present

## 2019-07-29 DIAGNOSIS — I10 Essential (primary) hypertension: Secondary | ICD-10-CM | POA: Diagnosis not present

## 2019-07-29 DIAGNOSIS — E782 Mixed hyperlipidemia: Secondary | ICD-10-CM | POA: Diagnosis not present

## 2019-08-17 DIAGNOSIS — E039 Hypothyroidism, unspecified: Secondary | ICD-10-CM | POA: Diagnosis not present

## 2019-08-29 DIAGNOSIS — E782 Mixed hyperlipidemia: Secondary | ICD-10-CM | POA: Diagnosis not present

## 2019-08-29 DIAGNOSIS — I10 Essential (primary) hypertension: Secondary | ICD-10-CM | POA: Diagnosis not present

## 2019-09-29 DIAGNOSIS — I10 Essential (primary) hypertension: Secondary | ICD-10-CM | POA: Diagnosis not present

## 2019-09-29 DIAGNOSIS — E782 Mixed hyperlipidemia: Secondary | ICD-10-CM | POA: Diagnosis not present

## 2019-10-28 DIAGNOSIS — I10 Essential (primary) hypertension: Secondary | ICD-10-CM | POA: Diagnosis not present

## 2019-10-28 DIAGNOSIS — E7849 Other hyperlipidemia: Secondary | ICD-10-CM | POA: Diagnosis not present

## 2019-12-28 DIAGNOSIS — I10 Essential (primary) hypertension: Secondary | ICD-10-CM | POA: Diagnosis not present

## 2019-12-28 DIAGNOSIS — E7849 Other hyperlipidemia: Secondary | ICD-10-CM | POA: Diagnosis not present

## 2020-01-04 DIAGNOSIS — I10 Essential (primary) hypertension: Secondary | ICD-10-CM | POA: Diagnosis not present

## 2020-01-04 DIAGNOSIS — E7800 Pure hypercholesterolemia, unspecified: Secondary | ICD-10-CM | POA: Diagnosis not present

## 2020-01-04 DIAGNOSIS — E782 Mixed hyperlipidemia: Secondary | ICD-10-CM | POA: Diagnosis not present

## 2020-01-04 DIAGNOSIS — E039 Hypothyroidism, unspecified: Secondary | ICD-10-CM | POA: Diagnosis not present

## 2020-01-04 DIAGNOSIS — R7301 Impaired fasting glucose: Secondary | ICD-10-CM | POA: Diagnosis not present

## 2020-01-10 DIAGNOSIS — R413 Other amnesia: Secondary | ICD-10-CM | POA: Diagnosis not present

## 2020-01-10 DIAGNOSIS — N189 Chronic kidney disease, unspecified: Secondary | ICD-10-CM | POA: Diagnosis not present

## 2020-01-10 DIAGNOSIS — Z0001 Encounter for general adult medical examination with abnormal findings: Secondary | ICD-10-CM | POA: Diagnosis not present

## 2020-01-10 DIAGNOSIS — I10 Essential (primary) hypertension: Secondary | ICD-10-CM | POA: Diagnosis not present

## 2020-01-10 DIAGNOSIS — E039 Hypothyroidism, unspecified: Secondary | ICD-10-CM | POA: Diagnosis not present

## 2020-01-27 DIAGNOSIS — I10 Essential (primary) hypertension: Secondary | ICD-10-CM | POA: Diagnosis not present

## 2020-01-27 DIAGNOSIS — E7849 Other hyperlipidemia: Secondary | ICD-10-CM | POA: Diagnosis not present

## 2020-03-28 DIAGNOSIS — N1832 Chronic kidney disease, stage 3b: Secondary | ICD-10-CM | POA: Diagnosis not present

## 2020-03-28 DIAGNOSIS — E039 Hypothyroidism, unspecified: Secondary | ICD-10-CM | POA: Diagnosis not present

## 2020-03-28 DIAGNOSIS — I129 Hypertensive chronic kidney disease with stage 1 through stage 4 chronic kidney disease, or unspecified chronic kidney disease: Secondary | ICD-10-CM | POA: Diagnosis not present

## 2020-03-28 DIAGNOSIS — E7849 Other hyperlipidemia: Secondary | ICD-10-CM | POA: Diagnosis not present

## 2020-04-09 DIAGNOSIS — N3001 Acute cystitis with hematuria: Secondary | ICD-10-CM | POA: Diagnosis not present

## 2020-04-09 DIAGNOSIS — K5989 Other specified functional intestinal disorders: Secondary | ICD-10-CM | POA: Diagnosis not present

## 2020-04-09 DIAGNOSIS — Z20822 Contact with and (suspected) exposure to covid-19: Secondary | ICD-10-CM | POA: Diagnosis not present

## 2020-04-09 DIAGNOSIS — K6389 Other specified diseases of intestine: Secondary | ICD-10-CM | POA: Diagnosis not present

## 2020-04-09 DIAGNOSIS — E860 Dehydration: Secondary | ICD-10-CM | POA: Diagnosis not present

## 2020-04-09 DIAGNOSIS — E7849 Other hyperlipidemia: Secondary | ICD-10-CM | POA: Diagnosis not present

## 2020-04-09 DIAGNOSIS — R1110 Vomiting, unspecified: Secondary | ICD-10-CM | POA: Diagnosis not present

## 2020-04-09 DIAGNOSIS — Z4682 Encounter for fitting and adjustment of non-vascular catheter: Secondary | ICD-10-CM | POA: Diagnosis not present

## 2020-04-09 DIAGNOSIS — I10 Essential (primary) hypertension: Secondary | ICD-10-CM | POA: Diagnosis not present

## 2020-04-09 DIAGNOSIS — R197 Diarrhea, unspecified: Secondary | ICD-10-CM | POA: Diagnosis not present

## 2020-04-09 DIAGNOSIS — J9811 Atelectasis: Secondary | ICD-10-CM | POA: Diagnosis not present

## 2020-04-09 DIAGNOSIS — Z85038 Personal history of other malignant neoplasm of large intestine: Secondary | ICD-10-CM | POA: Diagnosis not present

## 2020-04-09 DIAGNOSIS — I5041 Acute combined systolic (congestive) and diastolic (congestive) heart failure: Secondary | ICD-10-CM | POA: Diagnosis not present

## 2020-04-09 DIAGNOSIS — K56699 Other intestinal obstruction unspecified as to partial versus complete obstruction: Secondary | ICD-10-CM | POA: Diagnosis not present

## 2020-04-09 DIAGNOSIS — J90 Pleural effusion, not elsewhere classified: Secondary | ICD-10-CM | POA: Diagnosis not present

## 2020-04-09 DIAGNOSIS — J80 Acute respiratory distress syndrome: Secondary | ICD-10-CM | POA: Diagnosis not present

## 2020-04-09 DIAGNOSIS — K56600 Partial intestinal obstruction, unspecified as to cause: Secondary | ICD-10-CM | POA: Diagnosis not present

## 2020-04-09 DIAGNOSIS — N390 Urinary tract infection, site not specified: Secondary | ICD-10-CM | POA: Diagnosis not present

## 2020-04-09 DIAGNOSIS — N179 Acute kidney failure, unspecified: Secondary | ICD-10-CM | POA: Diagnosis not present

## 2020-04-09 DIAGNOSIS — E785 Hyperlipidemia, unspecified: Secondary | ICD-10-CM | POA: Diagnosis not present

## 2020-04-09 DIAGNOSIS — R0902 Hypoxemia: Secondary | ICD-10-CM | POA: Diagnosis not present

## 2020-04-09 DIAGNOSIS — J42 Unspecified chronic bronchitis: Secondary | ICD-10-CM | POA: Diagnosis not present

## 2020-04-09 DIAGNOSIS — I878 Other specified disorders of veins: Secondary | ICD-10-CM | POA: Diagnosis not present

## 2020-04-09 DIAGNOSIS — J9600 Acute respiratory failure, unspecified whether with hypoxia or hypercapnia: Secondary | ICD-10-CM | POA: Diagnosis not present

## 2020-04-09 DIAGNOSIS — N281 Cyst of kidney, acquired: Secondary | ICD-10-CM | POA: Diagnosis not present

## 2020-04-09 DIAGNOSIS — R05 Cough: Secondary | ICD-10-CM | POA: Diagnosis not present

## 2020-04-09 DIAGNOSIS — M109 Gout, unspecified: Secondary | ICD-10-CM | POA: Diagnosis not present

## 2020-04-09 DIAGNOSIS — N3000 Acute cystitis without hematuria: Secondary | ICD-10-CM | POA: Diagnosis not present

## 2020-04-09 DIAGNOSIS — I898 Other specified noninfective disorders of lymphatic vessels and lymph nodes: Secondary | ICD-10-CM | POA: Diagnosis not present

## 2020-04-09 DIAGNOSIS — I700 Atherosclerosis of aorta: Secondary | ICD-10-CM | POA: Diagnosis not present

## 2020-04-09 DIAGNOSIS — I517 Cardiomegaly: Secondary | ICD-10-CM | POA: Diagnosis not present

## 2020-04-09 DIAGNOSIS — M4186 Other forms of scoliosis, lumbar region: Secondary | ICD-10-CM | POA: Diagnosis not present

## 2020-04-09 DIAGNOSIS — K56609 Unspecified intestinal obstruction, unspecified as to partial versus complete obstruction: Secondary | ICD-10-CM | POA: Diagnosis not present

## 2020-04-09 DIAGNOSIS — K5669 Other partial intestinal obstruction: Secondary | ICD-10-CM | POA: Diagnosis not present

## 2020-04-09 DIAGNOSIS — E876 Hypokalemia: Secondary | ICD-10-CM | POA: Diagnosis not present

## 2020-04-19 DIAGNOSIS — I509 Heart failure, unspecified: Secondary | ICD-10-CM | POA: Diagnosis not present

## 2020-04-19 DIAGNOSIS — Z9981 Dependence on supplemental oxygen: Secondary | ICD-10-CM | POA: Diagnosis not present

## 2020-04-19 DIAGNOSIS — Z79899 Other long term (current) drug therapy: Secondary | ICD-10-CM | POA: Diagnosis not present

## 2020-04-19 DIAGNOSIS — E7849 Other hyperlipidemia: Secondary | ICD-10-CM | POA: Diagnosis not present

## 2020-04-19 DIAGNOSIS — I5040 Unspecified combined systolic (congestive) and diastolic (congestive) heart failure: Secondary | ICD-10-CM | POA: Diagnosis not present

## 2020-04-19 DIAGNOSIS — M109 Gout, unspecified: Secondary | ICD-10-CM | POA: Diagnosis not present

## 2020-04-19 DIAGNOSIS — I110 Hypertensive heart disease with heart failure: Secondary | ICD-10-CM | POA: Diagnosis not present

## 2020-04-19 DIAGNOSIS — Z85038 Personal history of other malignant neoplasm of large intestine: Secondary | ICD-10-CM | POA: Diagnosis not present

## 2020-04-19 DIAGNOSIS — N3001 Acute cystitis with hematuria: Secondary | ICD-10-CM | POA: Diagnosis not present

## 2020-04-21 DIAGNOSIS — M109 Gout, unspecified: Secondary | ICD-10-CM | POA: Diagnosis not present

## 2020-04-21 DIAGNOSIS — N3001 Acute cystitis with hematuria: Secondary | ICD-10-CM | POA: Diagnosis not present

## 2020-04-21 DIAGNOSIS — E7849 Other hyperlipidemia: Secondary | ICD-10-CM | POA: Diagnosis not present

## 2020-04-21 DIAGNOSIS — I110 Hypertensive heart disease with heart failure: Secondary | ICD-10-CM | POA: Diagnosis not present

## 2020-04-21 DIAGNOSIS — Z85038 Personal history of other malignant neoplasm of large intestine: Secondary | ICD-10-CM | POA: Diagnosis not present

## 2020-04-21 DIAGNOSIS — Z79899 Other long term (current) drug therapy: Secondary | ICD-10-CM | POA: Diagnosis not present

## 2020-04-21 DIAGNOSIS — Z9981 Dependence on supplemental oxygen: Secondary | ICD-10-CM | POA: Diagnosis not present

## 2020-04-21 DIAGNOSIS — I509 Heart failure, unspecified: Secondary | ICD-10-CM | POA: Diagnosis not present

## 2020-04-25 DIAGNOSIS — Z85038 Personal history of other malignant neoplasm of large intestine: Secondary | ICD-10-CM | POA: Diagnosis not present

## 2020-04-25 DIAGNOSIS — I509 Heart failure, unspecified: Secondary | ICD-10-CM | POA: Diagnosis not present

## 2020-04-25 DIAGNOSIS — N3001 Acute cystitis with hematuria: Secondary | ICD-10-CM | POA: Diagnosis not present

## 2020-04-25 DIAGNOSIS — I110 Hypertensive heart disease with heart failure: Secondary | ICD-10-CM | POA: Diagnosis not present

## 2020-04-25 DIAGNOSIS — Z79899 Other long term (current) drug therapy: Secondary | ICD-10-CM | POA: Diagnosis not present

## 2020-04-25 DIAGNOSIS — M109 Gout, unspecified: Secondary | ICD-10-CM | POA: Diagnosis not present

## 2020-04-25 DIAGNOSIS — Z9981 Dependence on supplemental oxygen: Secondary | ICD-10-CM | POA: Diagnosis not present

## 2020-04-25 DIAGNOSIS — E7849 Other hyperlipidemia: Secondary | ICD-10-CM | POA: Diagnosis not present

## 2020-04-26 DIAGNOSIS — N3090 Cystitis, unspecified without hematuria: Secondary | ICD-10-CM | POA: Diagnosis not present

## 2020-04-26 DIAGNOSIS — I110 Hypertensive heart disease with heart failure: Secondary | ICD-10-CM | POA: Diagnosis not present

## 2020-04-26 DIAGNOSIS — I509 Heart failure, unspecified: Secondary | ICD-10-CM | POA: Diagnosis not present

## 2020-04-26 DIAGNOSIS — M109 Gout, unspecified: Secondary | ICD-10-CM | POA: Diagnosis not present

## 2020-04-26 DIAGNOSIS — K56609 Unspecified intestinal obstruction, unspecified as to partial versus complete obstruction: Secondary | ICD-10-CM | POA: Diagnosis not present

## 2020-04-26 DIAGNOSIS — Z9981 Dependence on supplemental oxygen: Secondary | ICD-10-CM | POA: Diagnosis not present

## 2020-04-26 DIAGNOSIS — Z85038 Personal history of other malignant neoplasm of large intestine: Secondary | ICD-10-CM | POA: Diagnosis not present

## 2020-04-26 DIAGNOSIS — N3001 Acute cystitis with hematuria: Secondary | ICD-10-CM | POA: Diagnosis not present

## 2020-04-26 DIAGNOSIS — R5383 Other fatigue: Secondary | ICD-10-CM | POA: Diagnosis not present

## 2020-04-26 DIAGNOSIS — N189 Chronic kidney disease, unspecified: Secondary | ICD-10-CM | POA: Diagnosis not present

## 2020-04-26 DIAGNOSIS — I10 Essential (primary) hypertension: Secondary | ICD-10-CM | POA: Diagnosis not present

## 2020-04-26 DIAGNOSIS — E876 Hypokalemia: Secondary | ICD-10-CM | POA: Diagnosis not present

## 2020-04-26 DIAGNOSIS — E7849 Other hyperlipidemia: Secondary | ICD-10-CM | POA: Diagnosis not present

## 2020-04-26 DIAGNOSIS — Z79899 Other long term (current) drug therapy: Secondary | ICD-10-CM | POA: Diagnosis not present

## 2020-04-27 DIAGNOSIS — I129 Hypertensive chronic kidney disease with stage 1 through stage 4 chronic kidney disease, or unspecified chronic kidney disease: Secondary | ICD-10-CM | POA: Diagnosis not present

## 2020-04-27 DIAGNOSIS — Z9981 Dependence on supplemental oxygen: Secondary | ICD-10-CM | POA: Diagnosis not present

## 2020-04-27 DIAGNOSIS — I509 Heart failure, unspecified: Secondary | ICD-10-CM | POA: Diagnosis not present

## 2020-04-27 DIAGNOSIS — I110 Hypertensive heart disease with heart failure: Secondary | ICD-10-CM | POA: Diagnosis not present

## 2020-04-27 DIAGNOSIS — Z85038 Personal history of other malignant neoplasm of large intestine: Secondary | ICD-10-CM | POA: Diagnosis not present

## 2020-04-27 DIAGNOSIS — N1832 Chronic kidney disease, stage 3b: Secondary | ICD-10-CM | POA: Diagnosis not present

## 2020-04-27 DIAGNOSIS — E7849 Other hyperlipidemia: Secondary | ICD-10-CM | POA: Diagnosis not present

## 2020-04-27 DIAGNOSIS — N3001 Acute cystitis with hematuria: Secondary | ICD-10-CM | POA: Diagnosis not present

## 2020-04-27 DIAGNOSIS — Z79899 Other long term (current) drug therapy: Secondary | ICD-10-CM | POA: Diagnosis not present

## 2020-04-27 DIAGNOSIS — E039 Hypothyroidism, unspecified: Secondary | ICD-10-CM | POA: Diagnosis not present

## 2020-04-27 DIAGNOSIS — M109 Gout, unspecified: Secondary | ICD-10-CM | POA: Diagnosis not present

## 2020-04-28 DIAGNOSIS — I509 Heart failure, unspecified: Secondary | ICD-10-CM | POA: Diagnosis not present

## 2020-04-28 DIAGNOSIS — Z85038 Personal history of other malignant neoplasm of large intestine: Secondary | ICD-10-CM | POA: Diagnosis not present

## 2020-04-28 DIAGNOSIS — N3001 Acute cystitis with hematuria: Secondary | ICD-10-CM | POA: Diagnosis not present

## 2020-04-28 DIAGNOSIS — M109 Gout, unspecified: Secondary | ICD-10-CM | POA: Diagnosis not present

## 2020-04-28 DIAGNOSIS — Z79899 Other long term (current) drug therapy: Secondary | ICD-10-CM | POA: Diagnosis not present

## 2020-04-28 DIAGNOSIS — Z9981 Dependence on supplemental oxygen: Secondary | ICD-10-CM | POA: Diagnosis not present

## 2020-04-28 DIAGNOSIS — I110 Hypertensive heart disease with heart failure: Secondary | ICD-10-CM | POA: Diagnosis not present

## 2020-04-28 DIAGNOSIS — E7849 Other hyperlipidemia: Secondary | ICD-10-CM | POA: Diagnosis not present

## 2020-04-30 DIAGNOSIS — Z79899 Other long term (current) drug therapy: Secondary | ICD-10-CM | POA: Diagnosis not present

## 2020-04-30 DIAGNOSIS — I110 Hypertensive heart disease with heart failure: Secondary | ICD-10-CM | POA: Diagnosis not present

## 2020-04-30 DIAGNOSIS — Z9981 Dependence on supplemental oxygen: Secondary | ICD-10-CM | POA: Diagnosis not present

## 2020-04-30 DIAGNOSIS — I509 Heart failure, unspecified: Secondary | ICD-10-CM | POA: Diagnosis not present

## 2020-04-30 DIAGNOSIS — E7849 Other hyperlipidemia: Secondary | ICD-10-CM | POA: Diagnosis not present

## 2020-04-30 DIAGNOSIS — Z85038 Personal history of other malignant neoplasm of large intestine: Secondary | ICD-10-CM | POA: Diagnosis not present

## 2020-04-30 DIAGNOSIS — M109 Gout, unspecified: Secondary | ICD-10-CM | POA: Diagnosis not present

## 2020-04-30 DIAGNOSIS — N3001 Acute cystitis with hematuria: Secondary | ICD-10-CM | POA: Diagnosis not present

## 2020-05-01 DIAGNOSIS — Z9981 Dependence on supplemental oxygen: Secondary | ICD-10-CM | POA: Diagnosis not present

## 2020-05-01 DIAGNOSIS — N3001 Acute cystitis with hematuria: Secondary | ICD-10-CM | POA: Diagnosis not present

## 2020-05-01 DIAGNOSIS — M109 Gout, unspecified: Secondary | ICD-10-CM | POA: Diagnosis not present

## 2020-05-01 DIAGNOSIS — Z85038 Personal history of other malignant neoplasm of large intestine: Secondary | ICD-10-CM | POA: Diagnosis not present

## 2020-05-01 DIAGNOSIS — I509 Heart failure, unspecified: Secondary | ICD-10-CM | POA: Diagnosis not present

## 2020-05-01 DIAGNOSIS — I110 Hypertensive heart disease with heart failure: Secondary | ICD-10-CM | POA: Diagnosis not present

## 2020-05-01 DIAGNOSIS — E7849 Other hyperlipidemia: Secondary | ICD-10-CM | POA: Diagnosis not present

## 2020-05-01 DIAGNOSIS — Z79899 Other long term (current) drug therapy: Secondary | ICD-10-CM | POA: Diagnosis not present

## 2020-05-02 DIAGNOSIS — N3001 Acute cystitis with hematuria: Secondary | ICD-10-CM | POA: Diagnosis not present

## 2020-05-02 DIAGNOSIS — E7849 Other hyperlipidemia: Secondary | ICD-10-CM | POA: Diagnosis not present

## 2020-05-02 DIAGNOSIS — I110 Hypertensive heart disease with heart failure: Secondary | ICD-10-CM | POA: Diagnosis not present

## 2020-05-02 DIAGNOSIS — Z9981 Dependence on supplemental oxygen: Secondary | ICD-10-CM | POA: Diagnosis not present

## 2020-05-02 DIAGNOSIS — Z85038 Personal history of other malignant neoplasm of large intestine: Secondary | ICD-10-CM | POA: Diagnosis not present

## 2020-05-02 DIAGNOSIS — M109 Gout, unspecified: Secondary | ICD-10-CM | POA: Diagnosis not present

## 2020-05-02 DIAGNOSIS — Z79899 Other long term (current) drug therapy: Secondary | ICD-10-CM | POA: Diagnosis not present

## 2020-05-02 DIAGNOSIS — I509 Heart failure, unspecified: Secondary | ICD-10-CM | POA: Diagnosis not present

## 2020-05-03 DIAGNOSIS — M109 Gout, unspecified: Secondary | ICD-10-CM | POA: Diagnosis not present

## 2020-05-03 DIAGNOSIS — E7849 Other hyperlipidemia: Secondary | ICD-10-CM | POA: Diagnosis not present

## 2020-05-03 DIAGNOSIS — Z79899 Other long term (current) drug therapy: Secondary | ICD-10-CM | POA: Diagnosis not present

## 2020-05-03 DIAGNOSIS — I110 Hypertensive heart disease with heart failure: Secondary | ICD-10-CM | POA: Diagnosis not present

## 2020-05-03 DIAGNOSIS — Z9981 Dependence on supplemental oxygen: Secondary | ICD-10-CM | POA: Diagnosis not present

## 2020-05-03 DIAGNOSIS — Z85038 Personal history of other malignant neoplasm of large intestine: Secondary | ICD-10-CM | POA: Diagnosis not present

## 2020-05-03 DIAGNOSIS — I509 Heart failure, unspecified: Secondary | ICD-10-CM | POA: Diagnosis not present

## 2020-05-03 DIAGNOSIS — N3001 Acute cystitis with hematuria: Secondary | ICD-10-CM | POA: Diagnosis not present

## 2020-05-07 DIAGNOSIS — Z79899 Other long term (current) drug therapy: Secondary | ICD-10-CM | POA: Diagnosis not present

## 2020-05-07 DIAGNOSIS — M109 Gout, unspecified: Secondary | ICD-10-CM | POA: Diagnosis not present

## 2020-05-07 DIAGNOSIS — Z85038 Personal history of other malignant neoplasm of large intestine: Secondary | ICD-10-CM | POA: Diagnosis not present

## 2020-05-07 DIAGNOSIS — I110 Hypertensive heart disease with heart failure: Secondary | ICD-10-CM | POA: Diagnosis not present

## 2020-05-07 DIAGNOSIS — I509 Heart failure, unspecified: Secondary | ICD-10-CM | POA: Diagnosis not present

## 2020-05-07 DIAGNOSIS — N3001 Acute cystitis with hematuria: Secondary | ICD-10-CM | POA: Diagnosis not present

## 2020-05-07 DIAGNOSIS — Z9981 Dependence on supplemental oxygen: Secondary | ICD-10-CM | POA: Diagnosis not present

## 2020-05-07 DIAGNOSIS — E7849 Other hyperlipidemia: Secondary | ICD-10-CM | POA: Diagnosis not present

## 2020-05-08 DIAGNOSIS — Z79899 Other long term (current) drug therapy: Secondary | ICD-10-CM | POA: Diagnosis not present

## 2020-05-08 DIAGNOSIS — M109 Gout, unspecified: Secondary | ICD-10-CM | POA: Diagnosis not present

## 2020-05-08 DIAGNOSIS — I110 Hypertensive heart disease with heart failure: Secondary | ICD-10-CM | POA: Diagnosis not present

## 2020-05-08 DIAGNOSIS — N3001 Acute cystitis with hematuria: Secondary | ICD-10-CM | POA: Diagnosis not present

## 2020-05-08 DIAGNOSIS — I509 Heart failure, unspecified: Secondary | ICD-10-CM | POA: Diagnosis not present

## 2020-05-08 DIAGNOSIS — Z9981 Dependence on supplemental oxygen: Secondary | ICD-10-CM | POA: Diagnosis not present

## 2020-05-08 DIAGNOSIS — E7849 Other hyperlipidemia: Secondary | ICD-10-CM | POA: Diagnosis not present

## 2020-05-08 DIAGNOSIS — Z85038 Personal history of other malignant neoplasm of large intestine: Secondary | ICD-10-CM | POA: Diagnosis not present

## 2020-05-10 DIAGNOSIS — I509 Heart failure, unspecified: Secondary | ICD-10-CM | POA: Diagnosis not present

## 2020-05-10 DIAGNOSIS — M109 Gout, unspecified: Secondary | ICD-10-CM | POA: Diagnosis not present

## 2020-05-10 DIAGNOSIS — N3001 Acute cystitis with hematuria: Secondary | ICD-10-CM | POA: Diagnosis not present

## 2020-05-10 DIAGNOSIS — E7849 Other hyperlipidemia: Secondary | ICD-10-CM | POA: Diagnosis not present

## 2020-05-10 DIAGNOSIS — Z85038 Personal history of other malignant neoplasm of large intestine: Secondary | ICD-10-CM | POA: Diagnosis not present

## 2020-05-10 DIAGNOSIS — I110 Hypertensive heart disease with heart failure: Secondary | ICD-10-CM | POA: Diagnosis not present

## 2020-05-10 DIAGNOSIS — Z79899 Other long term (current) drug therapy: Secondary | ICD-10-CM | POA: Diagnosis not present

## 2020-05-10 DIAGNOSIS — Z9981 Dependence on supplemental oxygen: Secondary | ICD-10-CM | POA: Diagnosis not present

## 2020-05-15 DIAGNOSIS — I5041 Acute combined systolic (congestive) and diastolic (congestive) heart failure: Secondary | ICD-10-CM | POA: Diagnosis not present

## 2020-05-18 DIAGNOSIS — N3090 Cystitis, unspecified without hematuria: Secondary | ICD-10-CM | POA: Diagnosis not present

## 2020-05-18 DIAGNOSIS — N189 Chronic kidney disease, unspecified: Secondary | ICD-10-CM | POA: Diagnosis not present

## 2020-05-18 DIAGNOSIS — E876 Hypokalemia: Secondary | ICD-10-CM | POA: Diagnosis not present

## 2020-05-18 DIAGNOSIS — I10 Essential (primary) hypertension: Secondary | ICD-10-CM | POA: Diagnosis not present

## 2020-05-18 DIAGNOSIS — K56609 Unspecified intestinal obstruction, unspecified as to partial versus complete obstruction: Secondary | ICD-10-CM | POA: Diagnosis not present

## 2020-05-23 DIAGNOSIS — L89899 Pressure ulcer of other site, unspecified stage: Secondary | ICD-10-CM | POA: Diagnosis not present

## 2020-05-23 DIAGNOSIS — N3090 Cystitis, unspecified without hematuria: Secondary | ICD-10-CM | POA: Diagnosis not present

## 2020-05-23 DIAGNOSIS — R05 Cough: Secondary | ICD-10-CM | POA: Diagnosis not present

## 2020-05-23 DIAGNOSIS — Z7689 Persons encountering health services in other specified circumstances: Secondary | ICD-10-CM | POA: Diagnosis not present

## 2020-06-28 DIAGNOSIS — I129 Hypertensive chronic kidney disease with stage 1 through stage 4 chronic kidney disease, or unspecified chronic kidney disease: Secondary | ICD-10-CM | POA: Diagnosis not present

## 2020-06-28 DIAGNOSIS — E7849 Other hyperlipidemia: Secondary | ICD-10-CM | POA: Diagnosis not present

## 2020-06-28 DIAGNOSIS — N1832 Chronic kidney disease, stage 3b: Secondary | ICD-10-CM | POA: Diagnosis not present

## 2020-07-17 DIAGNOSIS — E039 Hypothyroidism, unspecified: Secondary | ICD-10-CM | POA: Diagnosis not present

## 2020-07-17 DIAGNOSIS — E876 Hypokalemia: Secondary | ICD-10-CM | POA: Diagnosis not present

## 2020-07-17 DIAGNOSIS — R739 Hyperglycemia, unspecified: Secondary | ICD-10-CM | POA: Diagnosis not present

## 2020-07-17 DIAGNOSIS — I10 Essential (primary) hypertension: Secondary | ICD-10-CM | POA: Diagnosis not present

## 2020-07-17 DIAGNOSIS — N189 Chronic kidney disease, unspecified: Secondary | ICD-10-CM | POA: Diagnosis not present

## 2020-07-19 DIAGNOSIS — I10 Essential (primary) hypertension: Secondary | ICD-10-CM | POA: Diagnosis not present

## 2020-07-19 DIAGNOSIS — R7301 Impaired fasting glucose: Secondary | ICD-10-CM | POA: Diagnosis not present

## 2020-07-19 DIAGNOSIS — R2689 Other abnormalities of gait and mobility: Secondary | ICD-10-CM | POA: Diagnosis not present

## 2020-07-19 DIAGNOSIS — E782 Mixed hyperlipidemia: Secondary | ICD-10-CM | POA: Diagnosis not present

## 2020-07-19 DIAGNOSIS — N189 Chronic kidney disease, unspecified: Secondary | ICD-10-CM | POA: Diagnosis not present

## 2020-07-19 DIAGNOSIS — Z23 Encounter for immunization: Secondary | ICD-10-CM | POA: Diagnosis not present

## 2020-08-28 DIAGNOSIS — I129 Hypertensive chronic kidney disease with stage 1 through stage 4 chronic kidney disease, or unspecified chronic kidney disease: Secondary | ICD-10-CM | POA: Diagnosis not present

## 2020-08-28 DIAGNOSIS — N1832 Chronic kidney disease, stage 3b: Secondary | ICD-10-CM | POA: Diagnosis not present

## 2020-08-28 DIAGNOSIS — E7849 Other hyperlipidemia: Secondary | ICD-10-CM | POA: Diagnosis not present

## 2020-09-05 DIAGNOSIS — J069 Acute upper respiratory infection, unspecified: Secondary | ICD-10-CM | POA: Diagnosis not present

## 2020-09-05 DIAGNOSIS — J40 Bronchitis, not specified as acute or chronic: Secondary | ICD-10-CM | POA: Diagnosis not present

## 2020-09-13 DIAGNOSIS — Z23 Encounter for immunization: Secondary | ICD-10-CM | POA: Diagnosis not present

## 2020-09-25 DIAGNOSIS — R410 Disorientation, unspecified: Secondary | ICD-10-CM | POA: Diagnosis not present

## 2020-09-25 DIAGNOSIS — N189 Chronic kidney disease, unspecified: Secondary | ICD-10-CM | POA: Diagnosis not present

## 2020-09-25 DIAGNOSIS — R300 Dysuria: Secondary | ICD-10-CM | POA: Diagnosis not present

## 2020-09-25 DIAGNOSIS — R350 Frequency of micturition: Secondary | ICD-10-CM | POA: Diagnosis not present

## 2020-09-25 DIAGNOSIS — I10 Essential (primary) hypertension: Secondary | ICD-10-CM | POA: Diagnosis not present

## 2020-09-28 DIAGNOSIS — E039 Hypothyroidism, unspecified: Secondary | ICD-10-CM | POA: Diagnosis not present

## 2020-09-28 DIAGNOSIS — N1832 Chronic kidney disease, stage 3b: Secondary | ICD-10-CM | POA: Diagnosis not present

## 2020-09-28 DIAGNOSIS — I129 Hypertensive chronic kidney disease with stage 1 through stage 4 chronic kidney disease, or unspecified chronic kidney disease: Secondary | ICD-10-CM | POA: Diagnosis not present

## 2020-09-28 DIAGNOSIS — E7849 Other hyperlipidemia: Secondary | ICD-10-CM | POA: Diagnosis not present

## 2020-10-27 DIAGNOSIS — E7849 Other hyperlipidemia: Secondary | ICD-10-CM | POA: Diagnosis not present

## 2020-10-27 DIAGNOSIS — N1832 Chronic kidney disease, stage 3b: Secondary | ICD-10-CM | POA: Diagnosis not present

## 2020-10-27 DIAGNOSIS — I129 Hypertensive chronic kidney disease with stage 1 through stage 4 chronic kidney disease, or unspecified chronic kidney disease: Secondary | ICD-10-CM | POA: Diagnosis not present

## 2020-10-27 DIAGNOSIS — E039 Hypothyroidism, unspecified: Secondary | ICD-10-CM | POA: Diagnosis not present

## 2020-11-29 DIAGNOSIS — R300 Dysuria: Secondary | ICD-10-CM | POA: Diagnosis not present

## 2020-12-06 DIAGNOSIS — D509 Iron deficiency anemia, unspecified: Secondary | ICD-10-CM | POA: Diagnosis not present

## 2020-12-06 DIAGNOSIS — I10 Essential (primary) hypertension: Secondary | ICD-10-CM | POA: Diagnosis not present

## 2020-12-06 DIAGNOSIS — D649 Anemia, unspecified: Secondary | ICD-10-CM | POA: Diagnosis not present

## 2020-12-06 DIAGNOSIS — E7849 Other hyperlipidemia: Secondary | ICD-10-CM | POA: Diagnosis not present

## 2020-12-06 DIAGNOSIS — E7800 Pure hypercholesterolemia, unspecified: Secondary | ICD-10-CM | POA: Diagnosis not present

## 2020-12-06 DIAGNOSIS — E611 Iron deficiency: Secondary | ICD-10-CM | POA: Diagnosis not present

## 2020-12-06 DIAGNOSIS — E782 Mixed hyperlipidemia: Secondary | ICD-10-CM | POA: Diagnosis not present

## 2020-12-06 DIAGNOSIS — R739 Hyperglycemia, unspecified: Secondary | ICD-10-CM | POA: Diagnosis not present

## 2020-12-06 DIAGNOSIS — E039 Hypothyroidism, unspecified: Secondary | ICD-10-CM | POA: Diagnosis not present

## 2020-12-11 DIAGNOSIS — R7301 Impaired fasting glucose: Secondary | ICD-10-CM | POA: Diagnosis not present

## 2020-12-11 DIAGNOSIS — E7849 Other hyperlipidemia: Secondary | ICD-10-CM | POA: Diagnosis not present

## 2020-12-11 DIAGNOSIS — R2689 Other abnormalities of gait and mobility: Secondary | ICD-10-CM | POA: Diagnosis not present

## 2020-12-11 DIAGNOSIS — R413 Other amnesia: Secondary | ICD-10-CM | POA: Diagnosis not present

## 2020-12-11 DIAGNOSIS — I10 Essential (primary) hypertension: Secondary | ICD-10-CM | POA: Diagnosis not present

## 2020-12-11 DIAGNOSIS — E039 Hypothyroidism, unspecified: Secondary | ICD-10-CM | POA: Diagnosis not present

## 2020-12-11 DIAGNOSIS — N189 Chronic kidney disease, unspecified: Secondary | ICD-10-CM | POA: Diagnosis not present

## 2020-12-26 DIAGNOSIS — E039 Hypothyroidism, unspecified: Secondary | ICD-10-CM | POA: Diagnosis not present

## 2020-12-26 DIAGNOSIS — N1832 Chronic kidney disease, stage 3b: Secondary | ICD-10-CM | POA: Diagnosis not present

## 2020-12-26 DIAGNOSIS — I129 Hypertensive chronic kidney disease with stage 1 through stage 4 chronic kidney disease, or unspecified chronic kidney disease: Secondary | ICD-10-CM | POA: Diagnosis not present

## 2020-12-26 DIAGNOSIS — E7849 Other hyperlipidemia: Secondary | ICD-10-CM | POA: Diagnosis not present

## 2021-01-22 DIAGNOSIS — E039 Hypothyroidism, unspecified: Secondary | ICD-10-CM | POA: Diagnosis not present

## 2021-02-01 DIAGNOSIS — E079 Disorder of thyroid, unspecified: Secondary | ICD-10-CM | POA: Diagnosis not present

## 2021-02-01 DIAGNOSIS — N390 Urinary tract infection, site not specified: Secondary | ICD-10-CM | POA: Diagnosis not present

## 2021-02-01 DIAGNOSIS — R0600 Dyspnea, unspecified: Secondary | ICD-10-CM | POA: Diagnosis not present

## 2021-02-01 DIAGNOSIS — K6389 Other specified diseases of intestine: Secondary | ICD-10-CM | POA: Diagnosis not present

## 2021-02-01 DIAGNOSIS — K5669 Other partial intestinal obstruction: Secondary | ICD-10-CM | POA: Diagnosis not present

## 2021-02-01 DIAGNOSIS — R6520 Severe sepsis without septic shock: Secondary | ICD-10-CM | POA: Diagnosis not present

## 2021-02-01 DIAGNOSIS — K56600 Partial intestinal obstruction, unspecified as to cause: Secondary | ICD-10-CM | POA: Diagnosis not present

## 2021-02-01 DIAGNOSIS — R000 Tachycardia, unspecified: Secondary | ICD-10-CM | POA: Diagnosis not present

## 2021-02-01 DIAGNOSIS — I10 Essential (primary) hypertension: Secondary | ICD-10-CM | POA: Diagnosis not present

## 2021-02-01 DIAGNOSIS — K56699 Other intestinal obstruction unspecified as to partial versus complete obstruction: Secondary | ICD-10-CM | POA: Diagnosis not present

## 2021-02-01 DIAGNOSIS — K652 Spontaneous bacterial peritonitis: Secondary | ICD-10-CM | POA: Diagnosis not present

## 2021-02-01 DIAGNOSIS — Z20822 Contact with and (suspected) exposure to covid-19: Secondary | ICD-10-CM | POA: Diagnosis not present

## 2021-02-01 DIAGNOSIS — R410 Disorientation, unspecified: Secondary | ICD-10-CM | POA: Diagnosis not present

## 2021-02-01 DIAGNOSIS — I959 Hypotension, unspecified: Secondary | ICD-10-CM | POA: Diagnosis not present

## 2021-02-01 DIAGNOSIS — Z66 Do not resuscitate: Secondary | ICD-10-CM | POA: Diagnosis not present

## 2021-02-01 DIAGNOSIS — Z792 Long term (current) use of antibiotics: Secondary | ICD-10-CM | POA: Diagnosis not present

## 2021-02-01 DIAGNOSIS — R739 Hyperglycemia, unspecified: Secondary | ICD-10-CM | POA: Diagnosis not present

## 2021-02-01 DIAGNOSIS — D509 Iron deficiency anemia, unspecified: Secondary | ICD-10-CM | POA: Diagnosis not present

## 2021-02-01 DIAGNOSIS — R195 Other fecal abnormalities: Secondary | ICD-10-CM | POA: Diagnosis not present

## 2021-02-01 DIAGNOSIS — R109 Unspecified abdominal pain: Secondary | ICD-10-CM | POA: Diagnosis not present

## 2021-02-01 DIAGNOSIS — R110 Nausea: Secondary | ICD-10-CM | POA: Diagnosis not present

## 2021-02-01 DIAGNOSIS — Z9049 Acquired absence of other specified parts of digestive tract: Secondary | ICD-10-CM | POA: Diagnosis not present

## 2021-02-01 DIAGNOSIS — A419 Sepsis, unspecified organism: Secondary | ICD-10-CM | POA: Diagnosis not present

## 2021-02-01 DIAGNOSIS — N281 Cyst of kidney, acquired: Secondary | ICD-10-CM | POA: Diagnosis not present

## 2021-02-01 DIAGNOSIS — K56609 Unspecified intestinal obstruction, unspecified as to partial versus complete obstruction: Secondary | ICD-10-CM | POA: Diagnosis not present

## 2021-02-01 DIAGNOSIS — R112 Nausea with vomiting, unspecified: Secondary | ICD-10-CM | POA: Diagnosis not present

## 2021-02-01 DIAGNOSIS — Z79899 Other long term (current) drug therapy: Secondary | ICD-10-CM | POA: Diagnosis not present

## 2021-02-01 DIAGNOSIS — T68XXXD Hypothermia, subsequent encounter: Secondary | ICD-10-CM | POA: Diagnosis not present

## 2021-02-01 DIAGNOSIS — Z85038 Personal history of other malignant neoplasm of large intestine: Secondary | ICD-10-CM | POA: Diagnosis not present

## 2021-02-01 DIAGNOSIS — R1110 Vomiting, unspecified: Secondary | ICD-10-CM | POA: Diagnosis not present

## 2021-02-01 DIAGNOSIS — E8770 Fluid overload, unspecified: Secondary | ICD-10-CM | POA: Diagnosis not present

## 2021-02-01 DIAGNOSIS — N3090 Cystitis, unspecified without hematuria: Secondary | ICD-10-CM | POA: Diagnosis not present

## 2021-02-01 DIAGNOSIS — D649 Anemia, unspecified: Secondary | ICD-10-CM | POA: Diagnosis not present

## 2021-02-01 DIAGNOSIS — K567 Ileus, unspecified: Secondary | ICD-10-CM | POA: Diagnosis not present

## 2021-02-01 DIAGNOSIS — M109 Gout, unspecified: Secondary | ICD-10-CM | POA: Diagnosis not present

## 2021-02-01 DIAGNOSIS — K6289 Other specified diseases of anus and rectum: Secondary | ICD-10-CM | POA: Diagnosis not present

## 2021-02-01 DIAGNOSIS — N179 Acute kidney failure, unspecified: Secondary | ICD-10-CM | POA: Diagnosis not present

## 2021-02-01 DIAGNOSIS — I517 Cardiomegaly: Secondary | ICD-10-CM | POA: Diagnosis not present

## 2021-02-02 DIAGNOSIS — A419 Sepsis, unspecified organism: Secondary | ICD-10-CM | POA: Diagnosis not present

## 2021-02-02 DIAGNOSIS — N390 Urinary tract infection, site not specified: Secondary | ICD-10-CM | POA: Diagnosis not present

## 2021-02-02 DIAGNOSIS — K5669 Other partial intestinal obstruction: Secondary | ICD-10-CM | POA: Diagnosis not present

## 2021-02-02 DIAGNOSIS — Z792 Long term (current) use of antibiotics: Secondary | ICD-10-CM | POA: Diagnosis not present

## 2021-02-02 DIAGNOSIS — N179 Acute kidney failure, unspecified: Secondary | ICD-10-CM | POA: Diagnosis not present

## 2021-02-02 DIAGNOSIS — Z9049 Acquired absence of other specified parts of digestive tract: Secondary | ICD-10-CM | POA: Diagnosis not present

## 2021-02-02 DIAGNOSIS — I959 Hypotension, unspecified: Secondary | ICD-10-CM | POA: Diagnosis not present

## 2021-02-02 DIAGNOSIS — K56609 Unspecified intestinal obstruction, unspecified as to partial versus complete obstruction: Secondary | ICD-10-CM | POA: Diagnosis not present

## 2021-02-02 DIAGNOSIS — R109 Unspecified abdominal pain: Secondary | ICD-10-CM | POA: Diagnosis not present

## 2021-02-03 DIAGNOSIS — N179 Acute kidney failure, unspecified: Secondary | ICD-10-CM | POA: Diagnosis not present

## 2021-02-03 DIAGNOSIS — A419 Sepsis, unspecified organism: Secondary | ICD-10-CM | POA: Diagnosis not present

## 2021-02-03 DIAGNOSIS — N390 Urinary tract infection, site not specified: Secondary | ICD-10-CM | POA: Diagnosis not present

## 2021-02-03 DIAGNOSIS — I959 Hypotension, unspecified: Secondary | ICD-10-CM | POA: Diagnosis not present

## 2021-02-03 DIAGNOSIS — K56609 Unspecified intestinal obstruction, unspecified as to partial versus complete obstruction: Secondary | ICD-10-CM | POA: Diagnosis not present

## 2021-02-04 DIAGNOSIS — D509 Iron deficiency anemia, unspecified: Secondary | ICD-10-CM | POA: Diagnosis not present

## 2021-02-04 DIAGNOSIS — K56609 Unspecified intestinal obstruction, unspecified as to partial versus complete obstruction: Secondary | ICD-10-CM | POA: Diagnosis not present

## 2021-02-04 DIAGNOSIS — N179 Acute kidney failure, unspecified: Secondary | ICD-10-CM | POA: Diagnosis not present

## 2021-02-04 DIAGNOSIS — N390 Urinary tract infection, site not specified: Secondary | ICD-10-CM | POA: Diagnosis not present

## 2021-02-04 DIAGNOSIS — A419 Sepsis, unspecified organism: Secondary | ICD-10-CM | POA: Diagnosis not present

## 2021-02-05 DIAGNOSIS — R0600 Dyspnea, unspecified: Secondary | ICD-10-CM | POA: Diagnosis not present

## 2021-02-05 DIAGNOSIS — I959 Hypotension, unspecified: Secondary | ICD-10-CM | POA: Diagnosis not present

## 2021-02-05 DIAGNOSIS — N179 Acute kidney failure, unspecified: Secondary | ICD-10-CM | POA: Diagnosis not present

## 2021-02-05 DIAGNOSIS — K56609 Unspecified intestinal obstruction, unspecified as to partial versus complete obstruction: Secondary | ICD-10-CM | POA: Diagnosis not present

## 2021-02-05 DIAGNOSIS — D649 Anemia, unspecified: Secondary | ICD-10-CM | POA: Diagnosis not present

## 2021-02-05 DIAGNOSIS — N390 Urinary tract infection, site not specified: Secondary | ICD-10-CM | POA: Diagnosis not present

## 2021-02-05 DIAGNOSIS — I517 Cardiomegaly: Secondary | ICD-10-CM | POA: Diagnosis not present

## 2021-02-05 DIAGNOSIS — A419 Sepsis, unspecified organism: Secondary | ICD-10-CM | POA: Diagnosis not present

## 2021-02-05 DIAGNOSIS — R195 Other fecal abnormalities: Secondary | ICD-10-CM | POA: Diagnosis not present

## 2021-02-06 DIAGNOSIS — I959 Hypotension, unspecified: Secondary | ICD-10-CM | POA: Diagnosis not present

## 2021-02-06 DIAGNOSIS — D509 Iron deficiency anemia, unspecified: Secondary | ICD-10-CM | POA: Diagnosis not present

## 2021-02-06 DIAGNOSIS — I517 Cardiomegaly: Secondary | ICD-10-CM | POA: Diagnosis not present

## 2021-02-06 DIAGNOSIS — N179 Acute kidney failure, unspecified: Secondary | ICD-10-CM | POA: Diagnosis not present

## 2021-02-06 DIAGNOSIS — E8770 Fluid overload, unspecified: Secondary | ICD-10-CM | POA: Diagnosis not present

## 2021-02-06 DIAGNOSIS — N390 Urinary tract infection, site not specified: Secondary | ICD-10-CM | POA: Diagnosis not present

## 2021-02-06 DIAGNOSIS — A419 Sepsis, unspecified organism: Secondary | ICD-10-CM | POA: Diagnosis not present

## 2021-02-06 DIAGNOSIS — K56609 Unspecified intestinal obstruction, unspecified as to partial versus complete obstruction: Secondary | ICD-10-CM | POA: Diagnosis not present

## 2021-02-06 DIAGNOSIS — K567 Ileus, unspecified: Secondary | ICD-10-CM | POA: Diagnosis not present

## 2021-02-07 DIAGNOSIS — R195 Other fecal abnormalities: Secondary | ICD-10-CM | POA: Diagnosis not present

## 2021-02-07 DIAGNOSIS — I517 Cardiomegaly: Secondary | ICD-10-CM | POA: Diagnosis not present

## 2021-02-07 DIAGNOSIS — I959 Hypotension, unspecified: Secondary | ICD-10-CM | POA: Diagnosis not present

## 2021-02-07 DIAGNOSIS — N179 Acute kidney failure, unspecified: Secondary | ICD-10-CM | POA: Diagnosis not present

## 2021-02-07 DIAGNOSIS — E8770 Fluid overload, unspecified: Secondary | ICD-10-CM | POA: Diagnosis not present

## 2021-02-07 DIAGNOSIS — D509 Iron deficiency anemia, unspecified: Secondary | ICD-10-CM | POA: Diagnosis not present

## 2021-02-07 DIAGNOSIS — N390 Urinary tract infection, site not specified: Secondary | ICD-10-CM | POA: Diagnosis not present

## 2021-02-07 DIAGNOSIS — Z66 Do not resuscitate: Secondary | ICD-10-CM | POA: Diagnosis not present

## 2021-02-08 DIAGNOSIS — D509 Iron deficiency anemia, unspecified: Secondary | ICD-10-CM | POA: Diagnosis not present

## 2021-02-08 DIAGNOSIS — N179 Acute kidney failure, unspecified: Secondary | ICD-10-CM | POA: Diagnosis not present

## 2021-02-08 DIAGNOSIS — I10 Essential (primary) hypertension: Secondary | ICD-10-CM | POA: Diagnosis not present

## 2021-02-08 DIAGNOSIS — K56609 Unspecified intestinal obstruction, unspecified as to partial versus complete obstruction: Secondary | ICD-10-CM | POA: Diagnosis not present

## 2021-02-08 DIAGNOSIS — R195 Other fecal abnormalities: Secondary | ICD-10-CM | POA: Diagnosis not present

## 2021-02-08 DIAGNOSIS — E8770 Fluid overload, unspecified: Secondary | ICD-10-CM | POA: Diagnosis not present

## 2021-02-08 DIAGNOSIS — N390 Urinary tract infection, site not specified: Secondary | ICD-10-CM | POA: Diagnosis not present

## 2021-02-08 DIAGNOSIS — A419 Sepsis, unspecified organism: Secondary | ICD-10-CM | POA: Diagnosis not present

## 2021-02-08 DIAGNOSIS — T68XXXD Hypothermia, subsequent encounter: Secondary | ICD-10-CM | POA: Diagnosis not present

## 2021-02-08 DIAGNOSIS — R739 Hyperglycemia, unspecified: Secondary | ICD-10-CM | POA: Diagnosis not present

## 2021-02-08 DIAGNOSIS — I959 Hypotension, unspecified: Secondary | ICD-10-CM | POA: Diagnosis not present

## 2021-02-08 DIAGNOSIS — K567 Ileus, unspecified: Secondary | ICD-10-CM | POA: Diagnosis not present

## 2021-02-10 DIAGNOSIS — M109 Gout, unspecified: Secondary | ICD-10-CM | POA: Diagnosis not present

## 2021-02-10 DIAGNOSIS — I700 Atherosclerosis of aorta: Secondary | ICD-10-CM | POA: Diagnosis not present

## 2021-02-10 DIAGNOSIS — K567 Ileus, unspecified: Secondary | ICD-10-CM | POA: Diagnosis not present

## 2021-02-10 DIAGNOSIS — K56699 Other intestinal obstruction unspecified as to partial versus complete obstruction: Secondary | ICD-10-CM | POA: Diagnosis not present

## 2021-02-10 DIAGNOSIS — I10 Essential (primary) hypertension: Secondary | ICD-10-CM | POA: Diagnosis not present

## 2021-02-10 DIAGNOSIS — K219 Gastro-esophageal reflux disease without esophagitis: Secondary | ICD-10-CM | POA: Diagnosis not present

## 2021-02-10 DIAGNOSIS — K5900 Constipation, unspecified: Secondary | ICD-10-CM | POA: Diagnosis not present

## 2021-02-10 DIAGNOSIS — D509 Iron deficiency anemia, unspecified: Secondary | ICD-10-CM | POA: Diagnosis not present

## 2021-02-11 DIAGNOSIS — K5900 Constipation, unspecified: Secondary | ICD-10-CM | POA: Diagnosis not present

## 2021-02-11 DIAGNOSIS — I700 Atherosclerosis of aorta: Secondary | ICD-10-CM | POA: Diagnosis not present

## 2021-02-11 DIAGNOSIS — M109 Gout, unspecified: Secondary | ICD-10-CM | POA: Diagnosis not present

## 2021-02-11 DIAGNOSIS — D509 Iron deficiency anemia, unspecified: Secondary | ICD-10-CM | POA: Diagnosis not present

## 2021-02-11 DIAGNOSIS — K56699 Other intestinal obstruction unspecified as to partial versus complete obstruction: Secondary | ICD-10-CM | POA: Diagnosis not present

## 2021-02-11 DIAGNOSIS — K219 Gastro-esophageal reflux disease without esophagitis: Secondary | ICD-10-CM | POA: Diagnosis not present

## 2021-02-11 DIAGNOSIS — I10 Essential (primary) hypertension: Secondary | ICD-10-CM | POA: Diagnosis not present

## 2021-02-11 DIAGNOSIS — K567 Ileus, unspecified: Secondary | ICD-10-CM | POA: Diagnosis not present

## 2021-02-12 DIAGNOSIS — K56600 Partial intestinal obstruction, unspecified as to cause: Secondary | ICD-10-CM | POA: Diagnosis not present

## 2021-02-12 DIAGNOSIS — N189 Chronic kidney disease, unspecified: Secondary | ICD-10-CM | POA: Diagnosis not present

## 2021-02-12 DIAGNOSIS — D649 Anemia, unspecified: Secondary | ICD-10-CM | POA: Diagnosis not present

## 2021-02-12 DIAGNOSIS — E039 Hypothyroidism, unspecified: Secondary | ICD-10-CM | POA: Diagnosis not present

## 2021-02-12 DIAGNOSIS — I10 Essential (primary) hypertension: Secondary | ICD-10-CM | POA: Diagnosis not present

## 2021-02-12 DIAGNOSIS — A419 Sepsis, unspecified organism: Secondary | ICD-10-CM | POA: Diagnosis not present

## 2021-02-14 DIAGNOSIS — K5900 Constipation, unspecified: Secondary | ICD-10-CM | POA: Diagnosis not present

## 2021-02-14 DIAGNOSIS — D509 Iron deficiency anemia, unspecified: Secondary | ICD-10-CM | POA: Diagnosis not present

## 2021-02-14 DIAGNOSIS — K56699 Other intestinal obstruction unspecified as to partial versus complete obstruction: Secondary | ICD-10-CM | POA: Diagnosis not present

## 2021-02-14 DIAGNOSIS — M109 Gout, unspecified: Secondary | ICD-10-CM | POA: Diagnosis not present

## 2021-02-14 DIAGNOSIS — K567 Ileus, unspecified: Secondary | ICD-10-CM | POA: Diagnosis not present

## 2021-02-14 DIAGNOSIS — K219 Gastro-esophageal reflux disease without esophagitis: Secondary | ICD-10-CM | POA: Diagnosis not present

## 2021-02-14 DIAGNOSIS — I700 Atherosclerosis of aorta: Secondary | ICD-10-CM | POA: Diagnosis not present

## 2021-02-14 DIAGNOSIS — I10 Essential (primary) hypertension: Secondary | ICD-10-CM | POA: Diagnosis not present

## 2021-02-15 DIAGNOSIS — D509 Iron deficiency anemia, unspecified: Secondary | ICD-10-CM | POA: Diagnosis not present

## 2021-02-15 DIAGNOSIS — K219 Gastro-esophageal reflux disease without esophagitis: Secondary | ICD-10-CM | POA: Diagnosis not present

## 2021-02-15 DIAGNOSIS — I10 Essential (primary) hypertension: Secondary | ICD-10-CM | POA: Diagnosis not present

## 2021-02-15 DIAGNOSIS — M109 Gout, unspecified: Secondary | ICD-10-CM | POA: Diagnosis not present

## 2021-02-15 DIAGNOSIS — K56699 Other intestinal obstruction unspecified as to partial versus complete obstruction: Secondary | ICD-10-CM | POA: Diagnosis not present

## 2021-02-15 DIAGNOSIS — I700 Atherosclerosis of aorta: Secondary | ICD-10-CM | POA: Diagnosis not present

## 2021-02-15 DIAGNOSIS — K5900 Constipation, unspecified: Secondary | ICD-10-CM | POA: Diagnosis not present

## 2021-02-15 DIAGNOSIS — K567 Ileus, unspecified: Secondary | ICD-10-CM | POA: Diagnosis not present

## 2021-02-18 DIAGNOSIS — D509 Iron deficiency anemia, unspecified: Secondary | ICD-10-CM | POA: Diagnosis not present

## 2021-02-18 DIAGNOSIS — K567 Ileus, unspecified: Secondary | ICD-10-CM | POA: Diagnosis not present

## 2021-02-18 DIAGNOSIS — K56699 Other intestinal obstruction unspecified as to partial versus complete obstruction: Secondary | ICD-10-CM | POA: Diagnosis not present

## 2021-02-18 DIAGNOSIS — M109 Gout, unspecified: Secondary | ICD-10-CM | POA: Diagnosis not present

## 2021-02-18 DIAGNOSIS — K5900 Constipation, unspecified: Secondary | ICD-10-CM | POA: Diagnosis not present

## 2021-02-18 DIAGNOSIS — K219 Gastro-esophageal reflux disease without esophagitis: Secondary | ICD-10-CM | POA: Diagnosis not present

## 2021-02-18 DIAGNOSIS — I700 Atherosclerosis of aorta: Secondary | ICD-10-CM | POA: Diagnosis not present

## 2021-02-18 DIAGNOSIS — I10 Essential (primary) hypertension: Secondary | ICD-10-CM | POA: Diagnosis not present

## 2021-02-21 DIAGNOSIS — D509 Iron deficiency anemia, unspecified: Secondary | ICD-10-CM | POA: Diagnosis not present

## 2021-02-21 DIAGNOSIS — K219 Gastro-esophageal reflux disease without esophagitis: Secondary | ICD-10-CM | POA: Diagnosis not present

## 2021-02-21 DIAGNOSIS — K567 Ileus, unspecified: Secondary | ICD-10-CM | POA: Diagnosis not present

## 2021-02-21 DIAGNOSIS — M109 Gout, unspecified: Secondary | ICD-10-CM | POA: Diagnosis not present

## 2021-02-21 DIAGNOSIS — I700 Atherosclerosis of aorta: Secondary | ICD-10-CM | POA: Diagnosis not present

## 2021-02-21 DIAGNOSIS — I10 Essential (primary) hypertension: Secondary | ICD-10-CM | POA: Diagnosis not present

## 2021-02-21 DIAGNOSIS — K56699 Other intestinal obstruction unspecified as to partial versus complete obstruction: Secondary | ICD-10-CM | POA: Diagnosis not present

## 2021-02-21 DIAGNOSIS — K5900 Constipation, unspecified: Secondary | ICD-10-CM | POA: Diagnosis not present

## 2021-02-22 DIAGNOSIS — K567 Ileus, unspecified: Secondary | ICD-10-CM | POA: Diagnosis not present

## 2021-02-22 DIAGNOSIS — K56699 Other intestinal obstruction unspecified as to partial versus complete obstruction: Secondary | ICD-10-CM | POA: Diagnosis not present

## 2021-02-22 DIAGNOSIS — D509 Iron deficiency anemia, unspecified: Secondary | ICD-10-CM | POA: Diagnosis not present

## 2021-02-22 DIAGNOSIS — K219 Gastro-esophageal reflux disease without esophagitis: Secondary | ICD-10-CM | POA: Diagnosis not present

## 2021-02-22 DIAGNOSIS — M109 Gout, unspecified: Secondary | ICD-10-CM | POA: Diagnosis not present

## 2021-02-22 DIAGNOSIS — I10 Essential (primary) hypertension: Secondary | ICD-10-CM | POA: Diagnosis not present

## 2021-02-22 DIAGNOSIS — I700 Atherosclerosis of aorta: Secondary | ICD-10-CM | POA: Diagnosis not present

## 2021-02-22 DIAGNOSIS — K5900 Constipation, unspecified: Secondary | ICD-10-CM | POA: Diagnosis not present

## 2021-02-25 DIAGNOSIS — D509 Iron deficiency anemia, unspecified: Secondary | ICD-10-CM | POA: Diagnosis not present

## 2021-02-25 DIAGNOSIS — K56600 Partial intestinal obstruction, unspecified as to cause: Secondary | ICD-10-CM | POA: Diagnosis not present

## 2021-02-25 DIAGNOSIS — K529 Noninfective gastroenteritis and colitis, unspecified: Secondary | ICD-10-CM | POA: Diagnosis not present

## 2021-02-25 DIAGNOSIS — K56609 Unspecified intestinal obstruction, unspecified as to partial versus complete obstruction: Secondary | ICD-10-CM | POA: Diagnosis not present

## 2021-02-25 DIAGNOSIS — M109 Gout, unspecified: Secondary | ICD-10-CM | POA: Diagnosis not present

## 2021-02-25 DIAGNOSIS — R197 Diarrhea, unspecified: Secondary | ICD-10-CM | POA: Diagnosis not present

## 2021-02-25 DIAGNOSIS — Z20822 Contact with and (suspected) exposure to covid-19: Secondary | ICD-10-CM | POA: Diagnosis not present

## 2021-02-25 DIAGNOSIS — N390 Urinary tract infection, site not specified: Secondary | ICD-10-CM | POA: Diagnosis not present

## 2021-02-25 DIAGNOSIS — R1111 Vomiting without nausea: Secondary | ICD-10-CM | POA: Diagnosis not present

## 2021-02-25 DIAGNOSIS — Z79899 Other long term (current) drug therapy: Secondary | ICD-10-CM | POA: Diagnosis not present

## 2021-02-25 DIAGNOSIS — K56601 Complete intestinal obstruction, unspecified as to cause: Secondary | ICD-10-CM | POA: Diagnosis not present

## 2021-02-25 DIAGNOSIS — R590 Localized enlarged lymph nodes: Secondary | ICD-10-CM | POA: Diagnosis not present

## 2021-02-25 DIAGNOSIS — Z85048 Personal history of other malignant neoplasm of rectum, rectosigmoid junction, and anus: Secondary | ICD-10-CM | POA: Diagnosis not present

## 2021-02-25 DIAGNOSIS — E079 Disorder of thyroid, unspecified: Secondary | ICD-10-CM | POA: Diagnosis not present

## 2021-02-25 DIAGNOSIS — D630 Anemia in neoplastic disease: Secondary | ICD-10-CM | POA: Diagnosis not present

## 2021-02-25 DIAGNOSIS — Z4682 Encounter for fitting and adjustment of non-vascular catheter: Secondary | ICD-10-CM | POA: Diagnosis not present

## 2021-02-25 DIAGNOSIS — R140 Abdominal distension (gaseous): Secondary | ICD-10-CM | POA: Diagnosis not present

## 2021-02-25 DIAGNOSIS — R112 Nausea with vomiting, unspecified: Secondary | ICD-10-CM | POA: Diagnosis not present

## 2021-02-25 DIAGNOSIS — N281 Cyst of kidney, acquired: Secondary | ICD-10-CM | POA: Diagnosis not present

## 2021-02-25 DIAGNOSIS — I10 Essential (primary) hypertension: Secondary | ICD-10-CM | POA: Diagnosis not present

## 2021-02-25 DIAGNOSIS — R062 Wheezing: Secondary | ICD-10-CM | POA: Diagnosis not present

## 2021-02-25 DIAGNOSIS — R109 Unspecified abdominal pain: Secondary | ICD-10-CM | POA: Diagnosis not present

## 2021-02-25 DIAGNOSIS — K3189 Other diseases of stomach and duodenum: Secondary | ICD-10-CM | POA: Diagnosis not present

## 2021-02-25 DIAGNOSIS — I700 Atherosclerosis of aorta: Secondary | ICD-10-CM | POA: Diagnosis not present

## 2021-02-25 DIAGNOSIS — K6389 Other specified diseases of intestine: Secondary | ICD-10-CM | POA: Diagnosis not present

## 2021-02-25 DIAGNOSIS — Z1629 Resistance to other single specified antibiotic: Secondary | ICD-10-CM | POA: Diagnosis not present

## 2021-02-25 DIAGNOSIS — E785 Hyperlipidemia, unspecified: Secondary | ICD-10-CM | POA: Diagnosis not present

## 2021-02-25 DIAGNOSIS — E7849 Other hyperlipidemia: Secondary | ICD-10-CM | POA: Diagnosis not present

## 2021-02-25 DIAGNOSIS — N3000 Acute cystitis without hematuria: Secondary | ICD-10-CM | POA: Diagnosis not present

## 2021-02-25 DIAGNOSIS — Z515 Encounter for palliative care: Secondary | ICD-10-CM | POA: Diagnosis not present

## 2021-02-25 DIAGNOSIS — Z743 Need for continuous supervision: Secondary | ICD-10-CM | POA: Diagnosis not present

## 2021-02-25 DIAGNOSIS — R110 Nausea: Secondary | ICD-10-CM | POA: Diagnosis not present

## 2021-02-25 DIAGNOSIS — I517 Cardiomegaly: Secondary | ICD-10-CM | POA: Diagnosis not present

## 2021-02-25 DIAGNOSIS — J986 Disorders of diaphragm: Secondary | ICD-10-CM | POA: Diagnosis not present

## 2021-02-25 DIAGNOSIS — Z66 Do not resuscitate: Secondary | ICD-10-CM | POA: Diagnosis not present

## 2021-03-06 DIAGNOSIS — D649 Anemia, unspecified: Secondary | ICD-10-CM | POA: Diagnosis not present

## 2021-03-06 DIAGNOSIS — K56609 Unspecified intestinal obstruction, unspecified as to partial versus complete obstruction: Secondary | ICD-10-CM | POA: Diagnosis not present

## 2021-03-06 DIAGNOSIS — Z85038 Personal history of other malignant neoplasm of large intestine: Secondary | ICD-10-CM | POA: Diagnosis not present

## 2021-03-06 DIAGNOSIS — E039 Hypothyroidism, unspecified: Secondary | ICD-10-CM | POA: Diagnosis not present

## 2021-03-06 DIAGNOSIS — N189 Chronic kidney disease, unspecified: Secondary | ICD-10-CM | POA: Diagnosis not present

## 2021-03-07 DIAGNOSIS — Z9049 Acquired absence of other specified parts of digestive tract: Secondary | ICD-10-CM | POA: Diagnosis not present

## 2021-03-07 DIAGNOSIS — M109 Gout, unspecified: Secondary | ICD-10-CM | POA: Diagnosis not present

## 2021-03-07 DIAGNOSIS — K567 Ileus, unspecified: Secondary | ICD-10-CM | POA: Diagnosis not present

## 2021-03-07 DIAGNOSIS — Z8719 Personal history of other diseases of the digestive system: Secondary | ICD-10-CM | POA: Diagnosis not present

## 2021-03-07 DIAGNOSIS — E7849 Other hyperlipidemia: Secondary | ICD-10-CM | POA: Diagnosis not present

## 2021-03-07 DIAGNOSIS — I10 Essential (primary) hypertension: Secondary | ICD-10-CM | POA: Diagnosis not present

## 2021-03-07 DIAGNOSIS — K219 Gastro-esophageal reflux disease without esophagitis: Secondary | ICD-10-CM | POA: Diagnosis not present

## 2021-03-07 DIAGNOSIS — I700 Atherosclerosis of aorta: Secondary | ICD-10-CM | POA: Diagnosis not present

## 2021-03-07 DIAGNOSIS — K56699 Other intestinal obstruction unspecified as to partial versus complete obstruction: Secondary | ICD-10-CM | POA: Diagnosis not present

## 2021-03-07 DIAGNOSIS — Z87440 Personal history of urinary (tract) infections: Secondary | ICD-10-CM | POA: Diagnosis not present

## 2021-03-07 DIAGNOSIS — D509 Iron deficiency anemia, unspecified: Secondary | ICD-10-CM | POA: Diagnosis not present

## 2021-03-07 DIAGNOSIS — K5900 Constipation, unspecified: Secondary | ICD-10-CM | POA: Diagnosis not present

## 2021-03-11 DIAGNOSIS — D509 Iron deficiency anemia, unspecified: Secondary | ICD-10-CM | POA: Diagnosis not present

## 2021-03-11 DIAGNOSIS — E7849 Other hyperlipidemia: Secondary | ICD-10-CM | POA: Diagnosis not present

## 2021-03-11 DIAGNOSIS — Z8719 Personal history of other diseases of the digestive system: Secondary | ICD-10-CM | POA: Diagnosis not present

## 2021-03-11 DIAGNOSIS — Z9049 Acquired absence of other specified parts of digestive tract: Secondary | ICD-10-CM | POA: Diagnosis not present

## 2021-03-11 DIAGNOSIS — M109 Gout, unspecified: Secondary | ICD-10-CM | POA: Diagnosis not present

## 2021-03-11 DIAGNOSIS — I10 Essential (primary) hypertension: Secondary | ICD-10-CM | POA: Diagnosis not present

## 2021-03-11 DIAGNOSIS — K219 Gastro-esophageal reflux disease without esophagitis: Secondary | ICD-10-CM | POA: Diagnosis not present

## 2021-03-11 DIAGNOSIS — I700 Atherosclerosis of aorta: Secondary | ICD-10-CM | POA: Diagnosis not present

## 2021-03-11 DIAGNOSIS — K567 Ileus, unspecified: Secondary | ICD-10-CM | POA: Diagnosis not present

## 2021-03-11 DIAGNOSIS — K5900 Constipation, unspecified: Secondary | ICD-10-CM | POA: Diagnosis not present

## 2021-03-11 DIAGNOSIS — K56699 Other intestinal obstruction unspecified as to partial versus complete obstruction: Secondary | ICD-10-CM | POA: Diagnosis not present

## 2021-03-11 DIAGNOSIS — Z87440 Personal history of urinary (tract) infections: Secondary | ICD-10-CM | POA: Diagnosis not present

## 2021-03-14 DIAGNOSIS — K567 Ileus, unspecified: Secondary | ICD-10-CM | POA: Diagnosis not present

## 2021-03-14 DIAGNOSIS — K219 Gastro-esophageal reflux disease without esophagitis: Secondary | ICD-10-CM | POA: Diagnosis not present

## 2021-03-14 DIAGNOSIS — D509 Iron deficiency anemia, unspecified: Secondary | ICD-10-CM | POA: Diagnosis not present

## 2021-03-14 DIAGNOSIS — Z87440 Personal history of urinary (tract) infections: Secondary | ICD-10-CM | POA: Diagnosis not present

## 2021-03-14 DIAGNOSIS — K5900 Constipation, unspecified: Secondary | ICD-10-CM | POA: Diagnosis not present

## 2021-03-14 DIAGNOSIS — E7849 Other hyperlipidemia: Secondary | ICD-10-CM | POA: Diagnosis not present

## 2021-03-14 DIAGNOSIS — M109 Gout, unspecified: Secondary | ICD-10-CM | POA: Diagnosis not present

## 2021-03-14 DIAGNOSIS — Z9049 Acquired absence of other specified parts of digestive tract: Secondary | ICD-10-CM | POA: Diagnosis not present

## 2021-03-14 DIAGNOSIS — I10 Essential (primary) hypertension: Secondary | ICD-10-CM | POA: Diagnosis not present

## 2021-03-14 DIAGNOSIS — K56699 Other intestinal obstruction unspecified as to partial versus complete obstruction: Secondary | ICD-10-CM | POA: Diagnosis not present

## 2021-03-14 DIAGNOSIS — I700 Atherosclerosis of aorta: Secondary | ICD-10-CM | POA: Diagnosis not present

## 2021-03-14 DIAGNOSIS — Z8719 Personal history of other diseases of the digestive system: Secondary | ICD-10-CM | POA: Diagnosis not present

## 2021-03-18 DIAGNOSIS — K56699 Other intestinal obstruction unspecified as to partial versus complete obstruction: Secondary | ICD-10-CM | POA: Diagnosis not present

## 2021-03-18 DIAGNOSIS — M109 Gout, unspecified: Secondary | ICD-10-CM | POA: Diagnosis not present

## 2021-03-18 DIAGNOSIS — Z9049 Acquired absence of other specified parts of digestive tract: Secondary | ICD-10-CM | POA: Diagnosis not present

## 2021-03-18 DIAGNOSIS — I700 Atherosclerosis of aorta: Secondary | ICD-10-CM | POA: Diagnosis not present

## 2021-03-18 DIAGNOSIS — E7849 Other hyperlipidemia: Secondary | ICD-10-CM | POA: Diagnosis not present

## 2021-03-18 DIAGNOSIS — D509 Iron deficiency anemia, unspecified: Secondary | ICD-10-CM | POA: Diagnosis not present

## 2021-03-18 DIAGNOSIS — K219 Gastro-esophageal reflux disease without esophagitis: Secondary | ICD-10-CM | POA: Diagnosis not present

## 2021-03-18 DIAGNOSIS — Z87440 Personal history of urinary (tract) infections: Secondary | ICD-10-CM | POA: Diagnosis not present

## 2021-03-18 DIAGNOSIS — K567 Ileus, unspecified: Secondary | ICD-10-CM | POA: Diagnosis not present

## 2021-03-18 DIAGNOSIS — I10 Essential (primary) hypertension: Secondary | ICD-10-CM | POA: Diagnosis not present

## 2021-03-18 DIAGNOSIS — Z8719 Personal history of other diseases of the digestive system: Secondary | ICD-10-CM | POA: Diagnosis not present

## 2021-03-18 DIAGNOSIS — K5900 Constipation, unspecified: Secondary | ICD-10-CM | POA: Diagnosis not present

## 2021-03-19 ENCOUNTER — Encounter: Payer: Self-pay | Admitting: *Deleted

## 2021-03-19 ENCOUNTER — Other Ambulatory Visit: Payer: Self-pay | Admitting: *Deleted

## 2021-03-19 NOTE — Patient Outreach (Signed)
[facility]) Care Management  03/19/2021  Courtney Oliver 1927-03-07 [id number]  Telephone outreach for referral from Dr. Quintin Alto to assist with patient placement.  Pt has had 2 hospitalizations within 30 days for same dx small bowel obstruction. Decisions made to not treat aggressively. Daughter and 2 sons are very active in her care and she has a paid caregiver that is with her for several hours around lunch and then again in the evening.   Other medical hx includes, HTN, thyroid disease, cystitis, gout and hypercholesterolemia.  Pt is ambulatory with walker, can feed herself. Someone needs to prepare foods, help with bathing and dressing.  Talked to daughter Courtney Oliver today who has already started the process for placement but shares that her mother does not know this. Courtney Oliver has taken her mother for an OV and an FL2 form has been completed. Courtney Oliver has taken the form to several facilities that they are interested in. [facility] has also talked with Courtney Oliver at social services to apply for LTC Medicaid. She is waiting on the hospital bills so she can spend down her mothers income for eligibility.  Suggested a home visit next week to introduce myself and start talking about a LTC plan with [name]. Daughter has agreed, to an appt next Tuesday in the morning. Courtney Oliver will attend and she will invite her brothers also.   Courtney Oliver. Courtney Neither, MSN, [facility] Gerontological Nurse Practitioner [facility] Care Management [phone]

## 2021-03-19 NOTE — Patient Outreach (Signed)
[facility]) Care Management  03/19/2021  Courtney Oliver [birth year] [id number]  Received new MD referral for [name]. FL2 form attached. Called MD office to verify the reason for referral was help with placement. Talked to [facility], who had filled out the referral documents who verified that this is the request. Advised I would be glad to assist and will update them as the process develops.  Called and talked briefly with Miguel Dibble, pt's daughter. She verifies that she is wanting assistance with placement, however, she is with her mother at present and cannot talk freely. She requested to call me later today which NP advised is OK.  Medical Hx: SBO, Probable colorectal malignancy, HTN, Thyroid disease, cystitis, gout, hypercholesterolemia, CKD.  Pt was hospitalized for recurrent SBO, 5/30-03/03/2021 at [facility].  Chart review reveals that palliative care was discussed at discharge, LTC placement, short term rehab. Pt did go home and has home health by [facility].

## 2021-03-20 ENCOUNTER — Other Ambulatory Visit: Payer: Self-pay

## 2021-03-20 ENCOUNTER — Emergency Department (HOSPITAL_COMMUNITY): Payer: Medicare Other

## 2021-03-20 ENCOUNTER — Encounter: Payer: Self-pay | Admitting: *Deleted

## 2021-03-20 ENCOUNTER — Inpatient Hospital Stay (HOSPITAL_COMMUNITY): Payer: Medicare Other

## 2021-03-20 ENCOUNTER — Encounter (HOSPITAL_COMMUNITY): Payer: Self-pay | Admitting: Radiology

## 2021-03-20 ENCOUNTER — Inpatient Hospital Stay (HOSPITAL_COMMUNITY)
Admission: EM | Admit: 2021-03-20 | Discharge: 2021-03-23 | DRG: 389 | Disposition: A | Discharge status: Hospice | Payer: Medicare Other | Attending: Family Medicine | Admitting: Family Medicine

## 2021-03-20 DIAGNOSIS — R112 Nausea with vomiting, unspecified: Secondary | ICD-10-CM | POA: Diagnosis not present

## 2021-03-20 DIAGNOSIS — E441 Mild protein-calorie malnutrition: Secondary | ICD-10-CM | POA: Diagnosis present

## 2021-03-20 DIAGNOSIS — Z452 Encounter for adjustment and management of vascular access device: Secondary | ICD-10-CM | POA: Diagnosis not present

## 2021-03-20 DIAGNOSIS — K529 Noninfective gastroenteritis and colitis, unspecified: Secondary | ICD-10-CM | POA: Diagnosis not present

## 2021-03-20 DIAGNOSIS — Z66 Do not resuscitate: Secondary | ICD-10-CM | POA: Diagnosis not present

## 2021-03-20 DIAGNOSIS — Z0189 Encounter for other specified special examinations: Secondary | ICD-10-CM

## 2021-03-20 DIAGNOSIS — Z87891 Personal history of nicotine dependence: Secondary | ICD-10-CM | POA: Diagnosis not present

## 2021-03-20 DIAGNOSIS — M160 Bilateral primary osteoarthritis of hip: Secondary | ICD-10-CM | POA: Diagnosis not present

## 2021-03-20 DIAGNOSIS — K56699 Other intestinal obstruction unspecified as to partial versus complete obstruction: Secondary | ICD-10-CM | POA: Diagnosis not present

## 2021-03-20 DIAGNOSIS — E079 Disorder of thyroid, unspecified: Secondary | ICD-10-CM | POA: Diagnosis present

## 2021-03-20 DIAGNOSIS — Z79899 Other long term (current) drug therapy: Secondary | ICD-10-CM | POA: Diagnosis not present

## 2021-03-20 DIAGNOSIS — M109 Gout, unspecified: Secondary | ICD-10-CM | POA: Diagnosis not present

## 2021-03-20 DIAGNOSIS — R000 Tachycardia, unspecified: Secondary | ICD-10-CM | POA: Diagnosis not present

## 2021-03-20 DIAGNOSIS — E7800 Pure hypercholesterolemia, unspecified: Secondary | ICD-10-CM | POA: Diagnosis present

## 2021-03-20 DIAGNOSIS — I4510 Unspecified right bundle-branch block: Secondary | ICD-10-CM | POA: Diagnosis not present

## 2021-03-20 DIAGNOSIS — Z8249 Family history of ischemic heart disease and other diseases of the circulatory system: Secondary | ICD-10-CM

## 2021-03-20 DIAGNOSIS — F0390 Unspecified dementia without behavioral disturbance: Secondary | ICD-10-CM | POA: Diagnosis present

## 2021-03-20 DIAGNOSIS — K56609 Unspecified intestinal obstruction, unspecified as to partial versus complete obstruction: Secondary | ICD-10-CM | POA: Diagnosis not present

## 2021-03-20 DIAGNOSIS — Z6825 Body mass index (BMI) 25.0-25.9, adult: Secondary | ICD-10-CM

## 2021-03-20 DIAGNOSIS — R140 Abdominal distension (gaseous): Secondary | ICD-10-CM | POA: Diagnosis not present

## 2021-03-20 DIAGNOSIS — K5939 Other megacolon: Secondary | ICD-10-CM | POA: Diagnosis not present

## 2021-03-20 DIAGNOSIS — Z515 Encounter for palliative care: Secondary | ICD-10-CM | POA: Diagnosis not present

## 2021-03-20 DIAGNOSIS — Z743 Need for continuous supervision: Secondary | ICD-10-CM | POA: Diagnosis not present

## 2021-03-20 DIAGNOSIS — I10 Essential (primary) hypertension: Secondary | ICD-10-CM | POA: Diagnosis not present

## 2021-03-20 DIAGNOSIS — Z20822 Contact with and (suspected) exposure to covid-19: Secondary | ICD-10-CM | POA: Diagnosis present

## 2021-03-20 DIAGNOSIS — Z4682 Encounter for fitting and adjustment of non-vascular catheter: Secondary | ICD-10-CM | POA: Diagnosis not present

## 2021-03-20 DIAGNOSIS — K6389 Other specified diseases of intestine: Secondary | ICD-10-CM | POA: Diagnosis not present

## 2021-03-20 DIAGNOSIS — Z85038 Personal history of other malignant neoplasm of large intestine: Secondary | ICD-10-CM | POA: Diagnosis not present

## 2021-03-20 DIAGNOSIS — R1111 Vomiting without nausea: Secondary | ICD-10-CM | POA: Diagnosis not present

## 2021-03-20 DIAGNOSIS — R0602 Shortness of breath: Secondary | ICD-10-CM | POA: Diagnosis not present

## 2021-03-20 DIAGNOSIS — Z7189 Other specified counseling: Secondary | ICD-10-CM | POA: Diagnosis not present

## 2021-03-20 LAB — CBC WITH DIFFERENTIAL/PLATELET
Abs Immature Granulocytes: 0.03 10*3/uL (ref 0.00–0.07)
Basophils Absolute: 0.1 10*3/uL (ref 0.0–0.1)
Basophils Relative: 1 %
Eosinophils Absolute: 0.1 10*3/uL (ref 0.0–0.5)
Eosinophils Relative: 1 %
HCT: 36.8 % (ref 36.0–46.0)
Hemoglobin: 11.7 g/dL — ABNORMAL LOW (ref 12.0–15.0)
Immature Granulocytes: 0 %
Lymphocytes Relative: 15 %
Lymphs Abs: 1.1 10*3/uL (ref 0.7–4.0)
MCH: 29.4 pg (ref 26.0–34.0)
MCHC: 31.8 g/dL (ref 30.0–36.0)
MCV: 92.5 fL (ref 80.0–100.0)
Monocytes Absolute: 0.7 10*3/uL (ref 0.1–1.0)
Monocytes Relative: 9 %
Neutro Abs: 5.4 10*3/uL (ref 1.7–7.7)
Neutrophils Relative %: 74 %
Platelets: 321 10*3/uL (ref 150–400)
RBC: 3.98 MIL/uL (ref 3.87–5.11)
RDW: 16.9 % — ABNORMAL HIGH (ref 11.5–15.5)
WBC: 7.3 10*3/uL (ref 4.0–10.5)
nRBC: 0 % (ref 0.0–0.2)

## 2021-03-20 LAB — COMPREHENSIVE METABOLIC PANEL
ALT: 11 U/L (ref 0–44)
AST: 15 U/L (ref 15–41)
Albumin: 3.2 g/dL — ABNORMAL LOW (ref 3.5–5.0)
Alkaline Phosphatase: 108 U/L (ref 38–126)
Anion gap: 9 (ref 5–15)
BUN: 19 mg/dL (ref 8–23)
CO2: 23 mmol/L (ref 22–32)
Calcium: 8.9 mg/dL (ref 8.9–10.3)
Chloride: 101 mmol/L (ref 98–111)
Creatinine, Ser: 0.9 mg/dL (ref 0.44–1.00)
GFR, Estimated: 60 mL/min — ABNORMAL LOW (ref >60)
Glucose, Bld: 130 mg/dL — ABNORMAL HIGH (ref 70–99)
Potassium: 4.4 mmol/L (ref 3.5–5.1)
Sodium: 133 mmol/L — ABNORMAL LOW (ref 135–145)
Total Bilirubin: 0.7 mg/dL (ref 0.3–1.2)
Total Protein: 6 g/dL — ABNORMAL LOW (ref 6.5–8.1)

## 2021-03-20 LAB — RESP PANEL BY RT-PCR (FLU A&B, COVID) ARPGX2
Influenza A by PCR: NEGATIVE
Influenza B by PCR: NEGATIVE
SARS Coronavirus 2 by RT PCR: NEGATIVE

## 2021-03-20 MED ORDER — SODIUM CHLORIDE 0.9 % IV SOLN: INTRAVENOUS | Status: DC

## 2021-03-20 MED ORDER — MORPHINE SULFATE (PF) 4 MG/ML IV SOLN
4.0000 mg | INTRAVENOUS | Status: DC | PRN
  Filled 2021-03-20: qty 1

## 2021-03-20 MED ORDER — ACETAMINOPHEN 650 MG RE SUPP: 650.0000 mg | Freq: Four times a day (QID) | RECTAL | Status: DC | PRN

## 2021-03-20 MED ORDER — ACETAMINOPHEN 325 MG PO TABS: 650.0000 mg | ORAL_TABLET | Freq: Four times a day (QID) | ORAL | Status: DC | PRN

## 2021-03-20 MED ORDER — HYDRALAZINE HCL 20 MG/ML IJ SOLN: 10.0000 mg | Freq: Four times a day (QID) | INTRAMUSCULAR | Status: DC | PRN

## 2021-03-20 MED ORDER — IOHEXOL 300 MG/ML  SOLN
100.0000 mL | Freq: Once | INTRAMUSCULAR | Status: AC | PRN
  Administered 2021-03-20: 100 mL via INTRAVENOUS

## 2021-03-20 MED ORDER — HEPARIN SODIUM (PORCINE) 5000 UNIT/ML IJ SOLN
5000.0000 [IU] | Freq: Three times a day (TID) | INTRAMUSCULAR | Status: DC
  Administered 2021-03-20 – 2021-03-22 (×3): 5000 [IU] via SUBCUTANEOUS
  Filled 2021-03-20 (×5): qty 1

## 2021-03-20 MED ORDER — ONDANSETRON HCL 4 MG PO TABS
4.0000 mg | ORAL_TABLET | Freq: Four times a day (QID) | ORAL | Status: DC | PRN
Start: 2021-03-20 — End: 2021-03-23

## 2021-03-20 MED ORDER — ONDANSETRON HCL 4 MG/2ML IJ SOLN: 4.0000 mg | Freq: Four times a day (QID) | INTRAMUSCULAR | Status: DC | PRN

## 2021-03-20 MED ORDER — LACTATED RINGERS IV BOLUS
500.0000 mL | Freq: Once | INTRAVENOUS | Status: AC
  Administered 2021-03-20: 500 mL via INTRAVENOUS

## 2021-03-20 MED ORDER — ONDANSETRON HCL 4 MG/2ML IJ SOLN
4.0000 mg | Freq: Once | INTRAMUSCULAR | Status: AC
  Administered 2021-03-20: 4 mg via INTRAVENOUS
  Filled 2021-03-20: qty 2

## 2021-03-20 NOTE — ED Provider Notes (Addendum)
[facility] EMERGENCY DEPARTMENT Provider Note   CSN: [id number] Arrival date & time: 03/20/21  1409     History Chief Complaint  Patient presents with   Nausea    Courtney Oliver is a 87 y.o. female.  Presents with chief complaint of vomiting and nausea.  Symptoms began last night and continued throughout today.  She states she vomited multiple times nonbloody nonbilious.  States that she feels slightly better at this time.  Denies fevers or cough.  Denies abdominal pain or diarrhea.  Denies any shortness of breath or chest pain.      Past Medical History:  Diagnosis Date   Gout    Hyperglycemia    Hyperlipidemia    Hypertension     Patient Active Problem List   Diagnosis Date Noted   SBO (small bowel obstruction) ([place]) 03/20/2021   Cystitis 01/07/2016   Primary colorectal malignancy ([place]) 07/14/2011   Thyroid disease 07/14/2011   Gout 02/28/2011   Hypercholesterolemia 02/28/2011   Hypertension 02/28/2011    Past Surgical History:  Procedure Laterality Date   [place]   COLONOSCOPY  09/17/2009   adenomatous polyp   COLONOSCOPY  08/04/2011   tubular adenoma   KNEE SURGERY  02/01/2013   TONSILLECTOMY AND ADENOIDECTOMY     TOTAL ABDOMINAL HYSTERECTOMY  1978     OB History   No obstetric history on file.     Family History  Problem Relation Age of Onset   Heart attack Father     Social History   Tobacco Use   Smoking status: Former    Pack years: 0.00    Types: Cigarettes   Smokeless tobacco: Never  Vaping Use   Vaping Use: Never used  Substance Use Topics   Alcohol use: Never   Drug use: Never    Home Medications Prior to Admission medications   Medication Sig Start Date End Date Taking? Authorizing Provider  allopurinol (ZYLOPRIM) 300 MG tablet Take 300 mg by mouth daily. 07/21/17  Yes [provider]  atorvastatin (LIPITOR) 40 MG tablet Take 20 mg by mouth daily. 08/21/17  Yes [provider]  EUTHYROX 137 MCG tablet Take 137 mcg by mouth daily. 03/07/21  Yes [provider]  FEROSUL 325 (65 Fe) MG tablet Take 325 mg by mouth daily. 02/08/21  Yes [provider]  losartan (COZAAR) 100 MG tablet Take 1 tablet by mouth daily. 01/24/21  Yes [provider]  nitroGLYCERIN (NITROSTAT) 0.4 MG SL tablet Place 0.4 mg under the tongue every 5 (five) minutes as needed for chest pain. 08/06/17  Yes [provider]  pantoprazole (PROTONIX) 40 MG tablet Take 1 tablet by mouth daily. 02/08/21  Yes [provider]    Allergies    Patient has no known allergies.  Review of Systems   Review of Systems  Constitutional:  Negative for fever.  HENT:  Negative for ear pain.   Eyes:  Negative for pain.  Respiratory:  Negative for cough.   Cardiovascular:  Negative for chest pain.  Gastrointestinal:  Positive for vomiting. Negative for abdominal pain.  Genitourinary:  Negative for flank pain.  Musculoskeletal:  Negative for back pain.  Skin:  Negative for rash.  Neurological:  Negative for headaches.   Physical Exam Updated Vital Signs BP (!) 137/95   Pulse (!) 104   Temp 97.7 F (36.5 C) (Oral)   Resp (!) 28   Ht 5\' 2"  (1.575 m)  Wt 64.9 kg   SpO2 95%   BMI 26.16 kg/m   Physical Exam Constitutional:      General: She is not in acute distress.    Appearance: Normal appearance.  HENT:     Head: Normocephalic.     Nose: Nose normal.  Eyes:     Extraocular Movements: Extraocular movements intact.  Cardiovascular:     Rate and Rhythm: Normal rate.  Pulmonary:     Effort: Pulmonary effort is normal.  Abdominal:     General: There is distension.     Palpations: Abdomen is soft.     Tenderness: There is no guarding or rebound.  Musculoskeletal:        General: Normal range of motion.     Cervical back: Normal range of motion.  Neurological:     General: No focal deficit present.     Mental Status: She is alert. Mental status is at  baseline.    ED Results / Procedures / Treatments   Labs (all labs ordered are listed, but only abnormal results are displayed) Labs Reviewed  CBC WITH DIFFERENTIAL/PLATELET - Abnormal; Notable for the following components:      Result Value   Hemoglobin 11.7 (*)    RDW 16.9 (*)    All other components within normal limits  COMPREHENSIVE METABOLIC PANEL - Abnormal; Notable for the following components:   Sodium 133 (*)    Glucose, Bld 130 (*)    Total Protein 6.0 (*)    Albumin 3.2 (*)    GFR, Estimated 60 (*)    All other components within normal limits  RESP PANEL BY RT-PCR (FLU A&B, COVID) ARPGX2  URINALYSIS, ROUTINE W REFLEX MICROSCOPIC    EKG EKG Interpretation  Date/Time:  Wednesday March 20 2021 14:31:58 EDT Ventricular Rate:  103 PR Interval:  141 QRS Duration: 135 QT Interval:  356 QTC Calculation: 466 R Axis:   74 Text Interpretation: Sinus tachycardia Atrial premature complex Right bundle branch block Confirmed by Thamas Jaegers ([id number]) on 03/20/2021 4:08:44 PM  Radiology CT Abdomen Pelvis W Contrast  Result Date: 03/20/2021 CLINICAL DATA:  Small bowel obstruction. EXAM: CT ABDOMEN AND PELVIS WITH CONTRAST TECHNIQUE: Multidetector CT imaging of the abdomen and pelvis was performed using the standard protocol following bolus administration of intravenous contrast. CONTRAST:  18mL OMNIPAQUE IOHEXOL 300 MG/ML  SOLN COMPARISON:  CT 02/27/2021 FINDINGS: Lower chest: Lung bases are clear. Hepatobiliary: No focal hepatic lesion. No biliary duct dilatation. Common bile duct is normal. New free fluid over the paraspinal liver (image 17/2. Pancreas: Pancreas is normal. No ductal dilatation. No pancreatic inflammation. Spleen: Normal spleen Adrenals/urinary tract: Adrenal glands and kidneys are normal. The ureters and bladder normal. Stomach/Bowel: Small hiatal hernia. Stomach appears normal. Duodenum is normal. Most proximal small bowel is normal. The mid ileum is significantly  dilated with fecalized internal contents. For example the mid small bowel measure up to 4.2 cm on image 44/2. There are long segments of distended small bowel. There is an apparent transition point noted on coronal image 37/5 with transition from stool-filled small bowel to decompression. There is more distal dilatation downstream of this transition which suggest multifocal process. The small bowel is collapsed leading up to the colon. The colon is predominantly collapsed. Mild inflammation through the sigmoid colon rectum (image 62/2. Vascular/Lymphatic: Abdominal aorta is normal caliber. No periportal or retroperitoneal adenopathy. No pelvic adenopathy. Reproductive: Post hysterectomy Other: No intraperitoneal free air. Musculoskeletal: No aggressive osseous lesion. IMPRESSION: 1. Small bowel  obstruction pattern. Significantly dilated loops of small bowel with fecalized internal contents. Potential several regions of small-bowel transition which would suggest adhesions. 2. No intraperitoneal free air, pneumatosis or portal venous gas. 3. New free fluid along the bare space of the liver. 4. Inflammation through the rectosigmoid colon similar comparison exam 5. Recommend surgical consultation small bowel obstruction. Electronically Signed   By: Suzy Bouchard M.D.   On: 03/20/2021 18:25    Procedures NG placement  Date/Time: 03/20/2021 8:51 PM Performed by: Luna Fuse, MD Authorized by: Luna Fuse, MD  Comments: NG tube placed by myself to the right nostril.  Initial attempt was unsuccessful" in her mouth.  Tube was pulled back and advanced again subsequently clear gastric contents by nursing staff.     Medications Ordered in ED Medications  morphine 4 MG/ML injection 4 mg (has no administration in time range)  0.9 %  sodium chloride infusion ( Intravenous New Bag/Given 03/20/21 2014)  lactated ringers bolus 500 mL (0 mLs Intravenous Stopped 03/20/21 1818)  ondansetron (ZOFRAN) injection 4 mg  (4 mg Intravenous Given 03/20/21 1726)  iohexol (OMNIPAQUE) 300 MG/ML solution 100 mL (100 mLs Intravenous Contrast Given 03/20/21 1815)    ED Course  I have reviewed the triage vital signs and the nursing notes.  Pertinent labs & imaging results that were available during my care of the patient were reviewed by me and considered in my medical decision making (see chart for details).    MDM Rules/Calculators/A&P                          On exam patient's vital signs within normal limits.  O2 saturation greater than 95% on room air which is appropriate.  Exam shows moderate to significant abdominal distention however with no abdominal tenderness or guarding or rebound.  Given the swelling of the abdomen, CT imaging pursued which was consistent with small bowel obstruction again with multiple transition points.  Case discussed with surgery, recommending NG tube and medical admission.    Final Clinical Impression(s) / ED Diagnoses Final diagnoses:  SBO (small bowel obstruction) [facility])    Rx / DC Orders ED Discharge Orders     None        Luna Fuse, MD 03/20/21 1906    Luna Fuse, MD 03/20/21 2051

## 2021-03-20 NOTE — ED Notes (Signed)
Pt drank all her ginger ale.

## 2021-03-20 NOTE — ED Triage Notes (Signed)
Pt c/o n/v/d and be checked for UTI. Pt went to [facility] about 2 weeks ago and had a UTI. Decline in PO intake . Pt lives alone and was very unsteady for EMS.

## 2021-03-20 NOTE — ED Notes (Signed)
Ginger ale given to pt.  

## 2021-03-20 NOTE — H&P (Signed)
TRH H&P    Patient Demographics:    Courtney Oliver, is a 87 y.o. female  MRN: [id number]  DOB - 07-05-1927  Admit Date - 03/20/2021  Referring MD/NP/PA: Almyra Free  Outpatient Primary MD for the patient is Sasser, Silvestre Moment, MD  Patient coming from: Home  Chief complaint- Nausea and vomiting   HPI:    Courtney Oliver  is a 87 y.o. female,with history of three prior bowel obstructions, HTN, and HLD presents to the ED with a chief complaint of nausea and vomiting. She admits that her memory is not as clear as it used to be. She thinks that the nausea and vomiting started this morning. She had approximately 3-4 episodes of non bloody emesis.  She reports no associated abdominal pain.  She does think her belly is a little bloated.  Her last normal bowel movement was the day prior to admission.  Her last normal meal was breakfast the day of admission.  She reports no fevers or dysuria, and no other symptoms.  She reports that otherwise she has been in her normal state of health.  It is important to note that in July 2021 patient did have her first bowel obstruction.  It was determined to most likely be secondary to adhesions.  Patient has had surgery for colon cancer, hysterectomy, and appendectomy in the past.  She was in the hospital for 11 days at that time at [facility].  She did well for almost a year, and then in May of this year she went into the hospital again for bowel obstruction and sepsis.  Sepsis at that time was secondary to UTI.  She was discharged home and 2 weeks later back in the hospital for bowel obstruction again.  She has been back home for almost a month.  Patient lives alone, ambulates with walker, and has a caretaker that checks on her from 1030 to 1:30 PM every day.  Daughter would like it to be known that a rectal mass was noted on imaging at [facility], but the patient decided not to do a colonoscopy  or further work-up of this mass.  Daughter is interested in getting more care for her mom.  She is also interested in talking about comfort care per patient.  Patient is DNR, and agreeable to palliative consult.  Patient does not smoke, does not drink, does not use illicit drugs.  Patient thinks she is vaccinated for COVID.  In the ED Patient is afebrile with a temperature 97.7, tachycardic with heart rate around 105, respiratory rate is 28, blood pressure 137/95, satting at 95% No leukocytosis with a white blood cell count of 7.3, hemoglobin 11.7 Chemistry panel reveals a slight hyponatremia at 133, is otherwise normal CT scan shows small bowel obstruction pattern.  Significantly dilated loops of small bowel with fecalized internal contents.  Potential several regions of small bowel transition which would suggest occasions.  New free fluid along the bare space of the liver.  Inflammation through the rectosigmoid colon similar to prior exam EKG shows a heart rate  of 103, sinus tachycardia, QTC 4 and 66 with right bundle branch block Gen surg consulted from ED and rec'd admission as well as NG tube - not likely a surgical candidate NG tube placed in the ED    Review of systems:    In addition to the HPI above,  No Fever-chills, No Headache, No changes with Vision or hearing, No problems swallowing food or Liquids, No Chest pain, Cough or Shortness of Breath, No Abdominal pain, bowel movements are regular, No Blood in stool or Urine, No dysuria, No new skin rashes or bruises, No new joints pains-aches,  No new weakness, tingling, numbness in any extremity, No recent weight gain or loss, No polyuria, polydypsia or polyphagia, No significant Mental Stressors.  All other systems reviewed and are negative.    Past History of the following :    Past Medical History:  Diagnosis Date   Gout    Hyperglycemia    Hyperlipidemia    Hypertension       Past Surgical History:   Procedure Laterality Date   APPENDECTOMY  1978   COLECTOMY  1996   COLONOSCOPY  09/17/2009   adenomatous polyp   COLONOSCOPY  08/04/2011   tubular adenoma   KNEE SURGERY  02/01/2013   TONSILLECTOMY AND ADENOIDECTOMY     TOTAL ABDOMINAL HYSTERECTOMY  1978      Social History:      Social History   Tobacco Use   Smoking status: Former    Pack years: 0.00    Types: Cigarettes   Smokeless tobacco: Never  Substance Use Topics   Alcohol use: Never       Family History :     Family History  Problem Relation Age of Onset   Heart attack Father       Home Medications:   Prior to Admission medications   Medication Sig Start Date End Date Taking? Authorizing Provider  allopurinol (ZYLOPRIM) 300 MG tablet Take 300 mg by mouth daily. 07/21/17  Yes [provider]  atorvastatin (LIPITOR) 40 MG tablet Take 20 mg by mouth daily. 08/21/17  Yes [provider]  EUTHYROX 137 MCG tablet Take 137 mcg by mouth daily. 03/07/21  Yes [provider]  FEROSUL 325 (65 Fe) MG tablet Take 325 mg by mouth daily. 02/08/21  Yes [provider]  losartan (COZAAR) 100 MG tablet Take 1 tablet by mouth daily. 01/24/21  Yes [provider]  nitroGLYCERIN (NITROSTAT) 0.4 MG SL tablet Place 0.4 mg under the tongue every 5 (five) minutes as needed for chest pain. 08/06/17  Yes [provider]  pantoprazole (PROTONIX) 40 MG tablet Take 1 tablet by mouth daily. 02/08/21  Yes [provider]     Allergies:    No Known Allergies   Physical Exam:   Vitals  Blood pressure (!) 137/95, pulse (!) 104, temperature 97.7 F (36.5 C), temperature source Oral, resp. rate (!) 28, height 5\' 2"  (1.575 m), weight 64.9 kg, SpO2 95 %.  1.  General: Patient lying supine in bed,  no acute distress   2. Psychiatric: Alert and oriented x 3, mood and behavior normal for situation, pleasant and cooperative with exam   3. Neurologic: Speech and language are  normal, face is symmetric, moves all 4 extremities voluntarily, at baseline without acute deficits on limited exam   4. HEENMT:  Head is atraumatic, normocephalic, pupils reactive to light, neck is supple, trachea is midline, mucous membranes are moist   5. Respiratory :  Lungs are clear to auscultation bilaterally without wheezing, rhonchi, rales, no cyanosis, no increase in work of breathing or accessory muscle use   6. Cardiovascular : Heart rate slightly tachycardic, rhythm is regular, no murmurs, rubs or gallops, no peripheral edema, peripheral pulses palpated   7. Gastrointestinal:  Abdomen is soft, with mild distention, nontender to palpation, no masses or organomegaly palpated   8. Skin:  Skin is warm, dry and intact without rashes, acute lesions, or ulcers on limited exam   9.Musculoskeletal:  No acute deformities or trauma, no asymmetry in tone, no peripheral edema, peripheral pulses palpated, no tenderness to palpation in the extremities    Data Review:    CBC Recent Labs  Lab 03/20/21 1549  WBC 7.3  HGB 11.7*  HCT 36.8  PLT 321  MCV 92.5  MCH 29.4  MCHC 31.8  RDW 16.9*  LYMPHSABS 1.1  MONOABS 0.7  EOSABS 0.1  BASOSABS 0.1   ------------------------------------------------------------------------------------------------------------------  Results for orders placed or performed during the hospital encounter of 03/20/21 (from the past 48 hour(s))  CBC with Differential     Status: Abnormal   Collection Time: 03/20/21  3:49 PM  Result Value Ref Range   WBC 7.3 4.0 - 10.5 K/uL   RBC 3.98 3.87 - 5.11 MIL/uL   Hemoglobin 11.7 (L) 12.0 - 15.0 g/dL   HCT 36.8 36.0 - 46.0 %   MCV 92.5 80.0 - 100.0 fL   MCH 29.4 26.0 - 34.0 pg   MCHC 31.8 30.0 - 36.0 g/dL   RDW 16.9 (H) 11.5 - 15.5 %   Platelets 321 150 - 400 K/uL   nRBC 0.0 0.0 - 0.2 %   Neutrophils Relative % 74 %   Neutro Abs 5.4 1.7 - 7.7 K/uL   Lymphocytes Relative 15 %   Lymphs Abs 1.1 0.7 - 4.0 K/uL    Monocytes Relative 9 %   Monocytes Absolute 0.7 0.1 - 1.0 K/uL   Eosinophils Relative 1 %   Eosinophils Absolute 0.1 0.0 - 0.5 K/uL   Basophils Relative 1 %   Basophils Absolute 0.1 0.0 - 0.1 K/uL   Immature Granulocytes 0 %   Abs Immature Granulocytes 0.03 0.00 - 0.07 K/uL    Comment: Performed at [facility], [address], [place], [place] [address]  Comprehensive metabolic panel     Status: Abnormal   Collection Time: 03/20/21  3:49 PM  Result Value Ref Range   Sodium 133 (L) 135 - 145 mmol/L   Potassium 4.4 3.5 - 5.1 mmol/L   Chloride 101 98 - 111 mmol/L   CO2 23 22 - 32 mmol/L   Glucose, Bld 130 (H) 70 - 99 mg/dL    Comment: Glucose reference range applies only to samples taken after fasting for at least 8 hours.   BUN 19 8 - 23 mg/dL   Creatinine, Ser 0.90 0.44 - 1.00 mg/dL   Calcium 8.9 8.9 - 10.3 mg/dL   Total Protein 6.0 (L) 6.5 - 8.1 g/dL   Albumin 3.2 (L) 3.5 - 5.0 g/dL   AST 15 15 - 41 U/L   ALT 11 0 - 44 U/L   Alkaline Phosphatase 108 38 - 126 U/L   Total Bilirubin 0.7 0.3 - 1.2 mg/dL   GFR, Estimated 60 (L) >60 mL/min    Comment: (NOTE) Calculated using the CKD-EPI Creatinine Equation (2021)    Anion gap 9 5 - 15    Comment: Performed at [facility], [address], [place],  [place] [address]    Chemistries  Recent Labs  Lab 03/20/21 1549  NA 133*  K 4.4  CL 101  CO2 23  GLUCOSE 130*  BUN 19  CREATININE 0.90  CALCIUM 8.9  AST 15  ALT 11  ALKPHOS 108  BILITOT 0.7   ------------------------------------------------------------------------------------------------------------------  ------------------------------------------------------------------------------------------------------------------ GFR: Estimated Creatinine Clearance: 35.3 mL/min (by C-G formula based on SCr of 0.9 mg/dL). Liver Function Tests: Recent Labs  Lab 03/20/21 1549  AST 15  ALT 11  ALKPHOS 108  BILITOT 0.7  PROT 6.0*  ALBUMIN 3.2*   No results for input(s):  LIPASE, AMYLASE in the last 168 hours. No results for input(s): AMMONIA in the last 168 hours. Coagulation Profile: No results for input(s): INR, PROTIME in the last 168 hours. Cardiac Enzymes: No results for input(s): CKTOTAL, CKMB, CKMBINDEX, TROPONINI in the last 168 hours. BNP (last 3 results) No results for input(s): PROBNP in the last 8760 hours. HbA1C: No results for input(s): HGBA1C in the last 72 hours. CBG: No results for input(s): GLUCAP in the last 168 hours. Lipid Profile: No results for input(s): CHOL, HDL, LDLCALC, TRIG, CHOLHDL, LDLDIRECT in the last 72 hours. Thyroid Function Tests: No results for input(s): TSH, T4TOTAL, FREET4, T3FREE, THYROIDAB in the last 72 hours. Anemia Panel: No results for input(s): VITAMINB12, FOLATE, FERRITIN, TIBC, IRON, RETICCTPCT in the last 72 hours.  --------------------------------------------------------------------------------------------------------------- Urine analysis: No results found for: COLORURINE, APPEARANCEUR, LABSPEC, PHURINE, GLUCOSEU, HGBUR, BILIRUBINUR, KETONESUR, PROTEINUR, UROBILINOGEN, NITRITE, LEUKOCYTESUR    Imaging Results:    CT Abdomen Pelvis W Contrast  Result Date: 03/20/2021 CLINICAL DATA:  Small bowel obstruction. EXAM: CT ABDOMEN AND PELVIS WITH CONTRAST TECHNIQUE: Multidetector CT imaging of the abdomen and pelvis was performed using the standard protocol following bolus administration of intravenous contrast. CONTRAST:  160mL OMNIPAQUE IOHEXOL 300 MG/ML  SOLN COMPARISON:  CT 02/27/2021 FINDINGS: Lower chest: Lung bases are clear. Hepatobiliary: No focal hepatic lesion. No biliary duct dilatation. Common bile duct is normal. New free fluid over the paraspinal liver (image 17/2. Pancreas: Pancreas is normal. No ductal dilatation. No pancreatic inflammation. Spleen: Normal spleen Adrenals/urinary tract: Adrenal glands and kidneys are normal. The ureters and bladder normal. Stomach/Bowel: Small hiatal hernia.  Stomach appears normal. Duodenum is normal. Most proximal small bowel is normal. The mid ileum is significantly dilated with fecalized internal contents. For example the mid small bowel measure up to 4.2 cm on image 44/2. There are long segments of distended small bowel. There is an apparent transition point noted on coronal image 37/5 with transition from stool-filled small bowel to decompression. There is more distal dilatation downstream of this transition which suggest multifocal process. The small bowel is collapsed leading up to the colon. The colon is predominantly collapsed. Mild inflammation through the sigmoid colon rectum (image 62/2. Vascular/Lymphatic: Abdominal aorta is normal caliber. No periportal or retroperitoneal adenopathy. No pelvic adenopathy. Reproductive: Post hysterectomy Other: No intraperitoneal free air. Musculoskeletal: No aggressive osseous lesion. IMPRESSION: 1. Small bowel obstruction pattern. Significantly dilated loops of small bowel with fecalized internal contents. Potential several regions of small-bowel transition which would suggest adhesions. 2. No intraperitoneal free air, pneumatosis or portal venous gas. 3. New free fluid along the bare space of the liver. 4. Inflammation through the rectosigmoid colon similar comparison exam 5. Recommend surgical consultation small bowel obstruction. Electronically Signed   By: Suzy Bouchard M.D.   On: 03/20/2021 18:25   DG Chest Port 1 View  Result Date: 03/20/2021 CLINICAL DATA:  Check gastric  catheter placement EXAM: PORTABLE CHEST 1 VIEW COMPARISON:  02/25/2021 FINDINGS: Gastric catheter is noted with the tip in the stomach. Proximal side port lies at the gastroesophageal junction. This should be advanced several cm deeper into the stomach. IMPRESSION: Gastric catheter as described. Further advancement into the stomach is recommended. Electronically Signed   By: Inez Catalina M.D.   On: 03/20/2021 21:09      Assessment & Plan:     Principal Problem:   SBO (small bowel obstruction) (HCC) Active Problems:   Hypercholesterolemia   Hypertension   Thyroid disease   SBO - recurrent As seen on CT Gen surg consulted and rec's NG tube and medicine admission Pain control with morphine NPO KUB in the AM Goals of care Daughter is interested in knowing more about keeping patient comfortable at home Palliative care consulted Daughter also considering placement - TOC consulted HTN Holding ARB 2/2 to NPO HLD Holding statin 2/2 NPO Mild protein cal mal Albumin 3.2 When patient is able to tolerate PO, encourage nutrient dense food choices Thyroid disease Holding synthroid 2/2 to    DVT Prophylaxis-   heparin - SCDs   AM Labs Ordered, also please review Full Orders  Family Communication: Admission, patients condition and plan of care including tests being ordered have been discussed with the patient and  daughter who indicate understanding and agree with the plan and Code Status.  Code Status:  DNR  Admission status: Inpatient :The appropriate admission status for this patient is INPATIENT. Inpatient status is judged to be reasonable and necessary in order to provide the required intensity of service to ensure the patient's safety. The patient's presenting symptoms, physical exam findings, and initial radiographic and laboratory data in the context of their chronic comorbidities is felt to place them at high risk for further clinical deterioration. Furthermore, it is not anticipated that the patient will be medically stable for discharge from the hospital within 2 midnights of admission. The following factors support the admission status of inpatient.     The patient's presenting symptoms include nausea and vomiting. The worrisome physical exam findings include abdominal distention and tachycardia. The initial radiographic and laboratory data are worrisome because of CT shows bowel obstruction with multiple  transition zones. The chronic co-morbidities include HTN, HLD.       * I certify that at the point of admission it is my clinical judgment that the patient will require inpatient hospital care spanning beyond 2 midnights from the point of admission due to high intensity of service, high risk for further deterioration and high frequency of surveillance required.*  Time spent in minutes : [place]

## 2021-03-20 NOTE — ED Notes (Signed)
NG tube advanced per radiologist recommendation

## 2021-03-20 NOTE — Telephone Encounter (Signed)
This encounter was created in error - please disregard.

## 2021-03-21 ENCOUNTER — Inpatient Hospital Stay (HOSPITAL_COMMUNITY): Payer: Medicare Other

## 2021-03-21 ENCOUNTER — Encounter (HOSPITAL_COMMUNITY): Payer: Self-pay | Admitting: Family Medicine

## 2021-03-21 DIAGNOSIS — Z515 Encounter for palliative care: Secondary | ICD-10-CM

## 2021-03-21 DIAGNOSIS — K56609 Unspecified intestinal obstruction, unspecified as to partial versus complete obstruction: Principal | ICD-10-CM

## 2021-03-21 DIAGNOSIS — Z7189 Other specified counseling: Secondary | ICD-10-CM

## 2021-03-21 DIAGNOSIS — I10 Essential (primary) hypertension: Secondary | ICD-10-CM

## 2021-03-21 DIAGNOSIS — E7800 Pure hypercholesterolemia, unspecified: Secondary | ICD-10-CM

## 2021-03-21 DIAGNOSIS — E079 Disorder of thyroid, unspecified: Secondary | ICD-10-CM

## 2021-03-21 LAB — CBC WITH DIFFERENTIAL/PLATELET
Abs Immature Granulocytes: 0.02 10*3/uL (ref 0.00–0.07)
Basophils Absolute: 0 10*3/uL (ref 0.0–0.1)
Basophils Relative: 1 %
Eosinophils Absolute: 0.1 10*3/uL (ref 0.0–0.5)
Eosinophils Relative: 1 %
HCT: 32.9 % — ABNORMAL LOW (ref 36.0–46.0)
Hemoglobin: 10.2 g/dL — ABNORMAL LOW (ref 12.0–15.0)
Immature Granulocytes: 0 %
Lymphocytes Relative: 21 %
Lymphs Abs: 1.2 10*3/uL (ref 0.7–4.0)
MCH: 28.9 pg (ref 26.0–34.0)
MCHC: 31 g/dL (ref 30.0–36.0)
MCV: 93.2 fL (ref 80.0–100.0)
Monocytes Absolute: 0.7 10*3/uL (ref 0.1–1.0)
Monocytes Relative: 13 %
Neutro Abs: 3.6 10*3/uL (ref 1.7–7.7)
Neutrophils Relative %: 64 %
Platelets: 297 10*3/uL (ref 150–400)
RBC: 3.53 MIL/uL — ABNORMAL LOW (ref 3.87–5.11)
RDW: 17 % — ABNORMAL HIGH (ref 11.5–15.5)
WBC: 5.7 10*3/uL (ref 4.0–10.5)
nRBC: 0 % (ref 0.0–0.2)

## 2021-03-21 LAB — COMPREHENSIVE METABOLIC PANEL
ALT: 9 U/L (ref 0–44)
AST: 12 U/L — ABNORMAL LOW (ref 15–41)
Albumin: 2.6 g/dL — ABNORMAL LOW (ref 3.5–5.0)
Alkaline Phosphatase: 91 U/L (ref 38–126)
Anion gap: 7 (ref 5–15)
BUN: 20 mg/dL (ref 8–23)
CO2: 24 mmol/L (ref 22–32)
Calcium: 8.3 mg/dL — ABNORMAL LOW (ref 8.9–10.3)
Chloride: 102 mmol/L (ref 98–111)
Creatinine, Ser: 0.94 mg/dL (ref 0.44–1.00)
GFR, Estimated: 57 mL/min — ABNORMAL LOW (ref >60)
Glucose, Bld: 128 mg/dL — ABNORMAL HIGH (ref 70–99)
Potassium: 4.5 mmol/L (ref 3.5–5.1)
Sodium: 133 mmol/L — ABNORMAL LOW (ref 135–145)
Total Bilirubin: 0.5 mg/dL (ref 0.3–1.2)
Total Protein: 5.1 g/dL — ABNORMAL LOW (ref 6.5–8.1)

## 2021-03-21 LAB — URINALYSIS, MICROSCOPIC (REFLEX): Squamous Epithelial / HPF: NONE SEEN (ref 0–5)

## 2021-03-21 LAB — URINALYSIS, ROUTINE W REFLEX MICROSCOPIC
Bilirubin Urine: NEGATIVE
Glucose, UA: NEGATIVE mg/dL
Ketones, ur: NEGATIVE mg/dL
Nitrite: POSITIVE — AB
Specific Gravity, Urine: 1.01 (ref 1.005–1.030)
pH: 5 (ref 5.0–8.0)

## 2021-03-21 LAB — MAGNESIUM: Magnesium: 1.9 mg/dL (ref 1.7–2.4)

## 2021-03-21 LAB — TSH: TSH: 10.905 u[IU]/mL — ABNORMAL HIGH (ref 0.350–4.500)

## 2021-03-21 MED ORDER — DIATRIZOATE MEGLUMINE & SODIUM 66-10 % PO SOLN: 90.0000 mL | Freq: Once | ORAL | Status: AC

## 2021-03-21 MED ORDER — DIATRIZOATE MEGLUMINE & SODIUM 66-10 % PO SOLN
ORAL | Status: AC
  Administered 2021-03-21: 90 mL via NASOGASTRIC
  Filled 2021-03-21: qty 90

## 2021-03-21 NOTE — Progress Notes (Signed)
Patient continues to pick at her NG tube causing it to bleed.  Nurse placed a piece of gauze between the NG tube and patient's nose to prevent further trauma.  Patient educated not to pick at the NG tube.

## 2021-03-21 NOTE — Consult Note (Signed)
[facility] Consult  Reason for Consult: SBO  Referring Physician: Dr. Wynetta Emery   Chief Complaint   Nausea     HPI: Courtney Oliver is a 87 y.o. female with SBO on CT after coming into the Ed with nausea and vomiting. She has had 3 prior SBO and resolved with conservative measures. She has dementia but is oriented to self, hospital [facility]) but corrects to [employer], year.  She says she still lives alone. She had 3-4 episodes of vomiting before coming to the hospital. An NG was placed and she had a BM the day before coming to the hospital. No current flatus or BM. She has a caregiver that checks on her during the day and brings her food.   Notes documenting possible rectal mass on OSH CT. CT here calls it inflammation of the rectosigmoid area.   Past Medical History:  Diagnosis Date   Gout    Hyperglycemia    Hyperlipidemia    Hypertension     Past Surgical History:  Procedure Laterality Date   APPENDECTOMY  1978   COLECTOMY  1996   COLONOSCOPY  09/17/2009   adenomatous polyp   COLONOSCOPY  08/04/2011   tubular adenoma   KNEE SURGERY  02/01/2013   TONSILLECTOMY AND ADENOIDECTOMY     TOTAL ABDOMINAL HYSTERECTOMY  1978    Family History  Problem Relation Age of Onset   Heart attack Father     Social History   Tobacco Use   Smoking status: Former    Pack years: 0.00    Types: Cigarettes   Smokeless tobacco: Never  Vaping Use   Vaping Use: Never used  Substance Use Topics   Alcohol use: Never   Drug use: Never    Medications: I have reviewed the patient's current medications. Prior to Admission:  Medications Prior to Admission  Medication Sig Dispense Refill Last Dose   allopurinol (ZYLOPRIM) 300 MG tablet Take 300 mg by mouth daily.   03/20/2021   atorvastatin (LIPITOR) 40 MG tablet Take 20 mg by mouth daily.   03/19/2021   EUTHYROX 137 MCG tablet Take 137 mcg by mouth daily.   03/20/2021   FEROSUL 325 (65 Fe) MG tablet Take 325  mg by mouth daily.   03/20/2021   losartan (COZAAR) 100 MG tablet Take 1 tablet by mouth daily.   03/20/2021   nitroGLYCERIN (NITROSTAT) 0.4 MG SL tablet Place 0.4 mg under the tongue every 5 (five) minutes as needed for chest pain.   unk at unk   pantoprazole (PROTONIX) 40 MG tablet Take 1 tablet by mouth daily.   03/20/2021   Scheduled:  heparin  5,000 Units Subcutaneous Q8H   Continuous:  sodium chloride 100 mL/hr at 03/20/21 2014   OEU:[id number] **OR** acetaminophen, hydrALAZINE, morphine injection, ondansetron **OR** ondansetron (ZOFRAN) IV  No Known Allergies   ROS:  A comprehensive review of systems was negative except for: Gastrointestinal: positive for abdominal pain, nausea, and vomiting  Blood pressure 115/62, pulse 97, temperature 98.1 F (36.7 C), temperature source Oral, resp. rate 18, height 5\' 2"  (1.575 m), weight 63.9 kg, SpO2 100 %. Physical Exam Vitals reviewed.  Constitutional:      Appearance: Normal appearance.  HENT:     Head: Normocephalic.     Nose: Nose normal.     Mouth/Throat:     Mouth: Mucous membranes are moist.  Eyes:     Extraocular Movements: Extraocular movements intact.  Cardiovascular:     Rate and Rhythm:  Normal rate.  Pulmonary:     Effort: Pulmonary effort is normal.  Abdominal:     General: There is distension.     Palpations: Abdomen is soft.     Tenderness: There is no abdominal tenderness.  Musculoskeletal:        General: No swelling.  Skin:    General: Skin is warm.  Neurological:     Mental Status: She is alert. Mental status is at baseline.     Comments: Oriented to self, hospital and 2022, redirects and answers logical questions easily  Psychiatric:        Mood and Affect: Mood normal.    Results: Results for orders placed or performed during the hospital encounter of 03/20/21 (from the past 48 hour(s))  CBC with Differential     Status: Abnormal   Collection Time: 03/20/21  3:49 PM  Result Value Ref Range   WBC  7.3 4.0 - 10.5 K/uL   RBC 3.98 3.87 - 5.11 MIL/uL   Hemoglobin 11.7 (L) 12.0 - 15.0 g/dL   HCT 36.8 36.0 - 46.0 %   MCV 92.5 80.0 - 100.0 fL   MCH 29.4 26.0 - 34.0 pg   MCHC 31.8 30.0 - 36.0 g/dL   RDW 16.9 (H) 11.5 - 15.5 %   Platelets 321 150 - 400 K/uL   nRBC 0.0 0.0 - 0.2 %   Neutrophils Relative % 74 %   Neutro Abs 5.4 1.7 - 7.7 K/uL   Lymphocytes Relative 15 %   Lymphs Abs 1.1 0.7 - 4.0 K/uL   Monocytes Relative 9 %   Monocytes Absolute 0.7 0.1 - 1.0 K/uL   Eosinophils Relative 1 %   Eosinophils Absolute 0.1 0.0 - 0.5 K/uL   Basophils Relative 1 %   Basophils Absolute 0.1 0.0 - 0.1 K/uL   Immature Granulocytes 0 %   Abs Immature Granulocytes 0.03 0.00 - 0.07 K/uL    Comment: Performed at [facility], [address], [place], [place] [address]  Comprehensive metabolic panel     Status: Abnormal   Collection Time: 03/20/21  3:49 PM  Result Value Ref Range   Sodium 133 (L) 135 - 145 mmol/L   Potassium 4.4 3.5 - 5.1 mmol/L   Chloride 101 98 - 111 mmol/L   CO2 23 22 - 32 mmol/L   Glucose, Bld 130 (H) 70 - 99 mg/dL    Comment: Glucose reference range applies only to samples taken after fasting for at least 8 hours.   BUN 19 8 - 23 mg/dL   Creatinine, Ser 0.90 0.44 - 1.00 mg/dL   Calcium 8.9 8.9 - 10.3 mg/dL   Total Protein 6.0 (L) 6.5 - 8.1 g/dL   Albumin 3.2 (L) 3.5 - 5.0 g/dL   AST 15 15 - 41 U/L   ALT 11 0 - 44 U/L   Alkaline Phosphatase 108 38 - 126 U/L   Total Bilirubin 0.7 0.3 - 1.2 mg/dL   GFR, Estimated 60 (L) >60 mL/min    Comment: (NOTE) Calculated using the CKD-EPI Creatinine Equation (2021)    Anion gap 9 5 - 15    Comment: Performed at [facility], [address], [place], [place] [address]  Resp Panel by RT-PCR (Flu A&B, Covid) Nasopharyngeal Swab     Status: None   Collection Time: 03/20/21  6:28 PM   Specimen: Nasopharyngeal Swab; Nasopharyngeal(NP) swabs in vial transport medium  Result Value Ref Range   SARS Coronavirus 2 by RT PCR NEGATIVE  NEGATIVE    Comment: (NOTE) SARS-CoV-2 target nucleic acids are NOT DETECTED.  The SARS-CoV-2 RNA is generally detectable in upper respiratory specimens during the acute phase of infection. The lowest concentration of SARS-CoV-2 viral copies this assay can detect is 138 copies/mL. A negative result does not preclude SARS-Cov-2 infection and should not be used as the sole basis for treatment or other patient management decisions. A negative result may occur with  improper specimen collection/handling, submission of specimen other than nasopharyngeal swab, presence of viral mutation(s) within the areas targeted by this assay, and inadequate number of viral copies(<138 copies/mL). A negative result must be combined with clinical observations, patient history, and epidemiological information. The expected result is Negative.  Fact Sheet for Patients:  EntrepreneurPulse.com.au  Fact Sheet for Healthcare Providers:  IncredibleEmployment.be  This test is no t yet approved or cleared by the [place] FDA and  has been authorized for detection and/or diagnosis of SARS-CoV-2 by FDA under an Emergency Use Authorization (EUA). This EUA will remain  in effect (meaning this test can be used) for the duration of the COVID-19 declaration under Section 564(b)(1) of the Act, 21 U.S.C.section 360bbb-3(b)(1), unless the authorization is terminated  or revoked sooner.       Influenza A by PCR NEGATIVE NEGATIVE   Influenza B by PCR NEGATIVE NEGATIVE    Comment: (NOTE) The Xpert Xpress SARS-CoV-2/FLU/RSV plus assay is intended as an aid in the diagnosis of influenza from Nasopharyngeal swab specimens and should not be used as a sole basis for treatment. Nasal washings and aspirates are unacceptable for Xpert Xpress SARS-CoV-2/FLU/RSV testing.  Fact Sheet for Patients: EntrepreneurPulse.com.au  Fact Sheet for Healthcare  Providers: IncredibleEmployment.be  This test is not yet approved or cleared by the [place] FDA and has been authorized for detection and/or diagnosis of SARS-CoV-2 by FDA under an Emergency Use Authorization (EUA). This EUA will remain in effect (meaning this test can be used) for the duration of the COVID-19 declaration under Section 564(b)(1) of the Act, 21 U.S.C. section 360bbb-3(b)(1), unless the authorization is terminated or revoked.  Performed at [facility], [address], [place], [place] [address]   Urinalysis, Routine w reflex microscopic Urine, Clean Catch     Status: Abnormal   Collection Time: 03/20/21  6:30 PM  Result Value Ref Range   Color, Urine YELLOW YELLOW   APPearance HAZY (A) CLEAR   Specific Gravity, Urine 1.010 1.005 - 1.030   pH 5.0 5.0 - 8.0   Glucose, UA NEGATIVE NEGATIVE mg/dL   Hgb urine dipstick TRACE (A) NEGATIVE   Bilirubin Urine NEGATIVE NEGATIVE   Ketones, ur NEGATIVE NEGATIVE mg/dL   Protein, ur TRACE (A) NEGATIVE mg/dL   Nitrite POSITIVE (A) NEGATIVE   Leukocytes,Ua SMALL (A) NEGATIVE    Comment: Performed at [facility], [address], [place], [place] [address]  Urinalysis, Microscopic (reflex)     Status: Abnormal   Collection Time: 03/20/21  6:30 PM  Result Value Ref Range   RBC / HPF 0-5 0 - 5 RBC/hpf   WBC, UA 6-10 0 - 5 WBC/hpf   Bacteria, UA MANY (A) NONE SEEN   Squamous Epithelial / LPF NONE SEEN 0 - 5    Comment: Performed at [facility], [address], [place], [place] [address]  Comprehensive metabolic panel     Status: Abnormal   Collection Time: 03/21/21  4:07 AM  Result Value Ref Range   Sodium 133 (L) 135 - 145 mmol/L  Potassium 4.5 3.5 - 5.1 mmol/L   Chloride 102 98 - 111 mmol/L   CO2 24 22 - 32 mmol/L   Glucose, Bld 128 (H) 70 - 99 mg/dL    Comment: Glucose reference range applies only to samples taken after fasting for at least 8 hours.   BUN 20 8 - 23 mg/dL   Creatinine, Ser 0.94 0.44 -  1.00 mg/dL   Calcium 8.3 (L) 8.9 - 10.3 mg/dL   Total Protein 5.1 (L) 6.5 - 8.1 g/dL   Albumin 2.6 (L) 3.5 - 5.0 g/dL   AST 12 (L) 15 - 41 U/L   ALT 9 0 - 44 U/L   Alkaline Phosphatase 91 38 - 126 U/L   Total Bilirubin 0.5 0.3 - 1.2 mg/dL   GFR, Estimated 57 (L) >60 mL/min    Comment: (NOTE) Calculated using the CKD-EPI Creatinine Equation (2021)    Anion gap 7 5 - 15    Comment: Performed at [facility], [address], [place], [place] [address]  Magnesium     Status: None   Collection Time: 03/21/21  4:07 AM  Result Value Ref Range   Magnesium 1.9 1.7 - 2.4 mg/dL    Comment: Performed at [facility], [address] [place], [place] [address]  CBC WITH DIFFERENTIAL     Status: Abnormal   Collection Time: 03/21/21  4:07 AM  Result Value Ref Range   WBC 5.7 4.0 - 10.5 K/uL   RBC 3.53 (L) 3.87 - 5.11 MIL/uL   Hemoglobin 10.2 (L) 12.0 - 15.0 g/dL   HCT 32.9 (L) 36.0 - 46.0 %   MCV 93.2 80.0 - 100.0 fL   MCH 28.9 26.0 - 34.0 pg   MCHC 31.0 30.0 - 36.0 g/dL   RDW 17.0 (H) 11.5 - 15.5 %   Platelets 297 150 - 400 K/uL   nRBC 0.0 0.0 - 0.2 %   Neutrophils Relative % 64 %   Neutro Abs 3.6 1.7 - 7.7 K/uL   Lymphocytes Relative 21 %   Lymphs Abs 1.2 0.7 - 4.0 K/uL   Monocytes Relative 13 %   Monocytes Absolute 0.7 0.1 - 1.0 K/uL   Eosinophils Relative 1 %   Eosinophils Absolute 0.1 0.0 - 0.5 K/uL   Basophils Relative 1 %   Basophils Absolute 0.0 0.0 - 0.1 K/uL   Immature Granulocytes 0 %   Abs Immature Granulocytes 0.02 0.00 - 0.07 K/uL    Comment: Performed at [facility], [address], [place], [place] [address]  TSH     Status: Abnormal   Collection Time: 03/21/21  4:07 AM  Result Value Ref Range   TSH 10.905 (H) 0.350 - 4.500 uIU/mL    Comment: Performed by a 3rd Generation assay with a functional sensitivity of <=0.01 uIU/mL. Performed at [facility], [address], [place], [place] [address]    Personally reviewed- small bowel with fecalization and transition  points. Thickening of the sigmoid area Abd 1 View (KUB)  Result Date: 03/21/2021 CLINICAL DATA:  NG tube.  Shortness of breath. EXAM: ABDOMEN - 1 VIEW COMPARISON:  Chest x-ray 03/20/2021.  CT 03/20/2021. FINDINGS: NG tube has been advanced. Its tip is coiled in the stomach. Persistent prominently dilated loops of bowel again noted. No free air identified. Contrast from prior CT noted the bladder. Visceral atherosclerotic vascular calcification. Degenerative changes and scoliosis lumbar spine. Degenerative changes both hips. IMPRESSION: NG tube has been advanced. Its tip is coiled in stomach. Persistent prominent  dilated loops of bowel again noted Electronically Signed   By: Marcello Moores  Register   On: 03/21/2021 06:20   CT Abdomen Pelvis W Contrast  Result Date: 03/20/2021 CLINICAL DATA:  Small bowel obstruction. EXAM: CT ABDOMEN AND PELVIS WITH CONTRAST TECHNIQUE: Multidetector CT imaging of the abdomen and pelvis was performed using the standard protocol following bolus administration of intravenous contrast. CONTRAST:  160mL OMNIPAQUE IOHEXOL 300 MG/ML  SOLN COMPARISON:  CT 02/27/2021 FINDINGS: Lower chest: Lung bases are clear. Hepatobiliary: No focal hepatic lesion. No biliary duct dilatation. Common bile duct is normal. New free fluid over the paraspinal liver (image 17/2. Pancreas: Pancreas is normal. No ductal dilatation. No pancreatic inflammation. Spleen: Normal spleen Adrenals/urinary tract: Adrenal glands and kidneys are normal. The ureters and bladder normal. Stomach/Bowel: Small hiatal hernia. Stomach appears normal. Duodenum is normal. Most proximal small bowel is normal. The mid ileum is significantly dilated with fecalized internal contents. For example the mid small bowel measure up to 4.2 cm on image 44/2. There are long segments of distended small bowel. There is an apparent transition point noted on coronal image 37/5 with transition from stool-filled small bowel to decompression. There is  more distal dilatation downstream of this transition which suggest multifocal process. The small bowel is collapsed leading up to the colon. The colon is predominantly collapsed. Mild inflammation through the sigmoid colon rectum (image 62/2. Vascular/Lymphatic: Abdominal aorta is normal caliber. No periportal or retroperitoneal adenopathy. No pelvic adenopathy. Reproductive: Post hysterectomy Other: No intraperitoneal free air. Musculoskeletal: No aggressive osseous lesion. IMPRESSION: 1. Small bowel obstruction pattern. Significantly dilated loops of small bowel with fecalized internal contents. Potential several regions of small-bowel transition which would suggest adhesions. 2. No intraperitoneal free air, pneumatosis or portal venous gas. 3. New free fluid along the bare space of the liver. 4. Inflammation through the rectosigmoid colon similar comparison exam 5. Recommend surgical consultation small bowel obstruction. Electronically Signed   By: Suzy Bouchard M.D.   On: 03/20/2021 18:25   DG Chest Port 1 View  Result Date: 03/20/2021 CLINICAL DATA:  Check gastric catheter placement EXAM: PORTABLE CHEST 1 VIEW COMPARISON:  02/25/2021 FINDINGS: Gastric catheter is noted with the tip in the stomach. Proximal side port lies at the gastroesophageal junction. This should be advanced several cm deeper into the stomach. IMPRESSION: Gastric catheter as described. Further advancement into the stomach is recommended. Electronically Signed   By: Inez Catalina M.D.   On: 03/20/2021 21:09     Assessment & Plan:  Courtney Oliver is a 87 y.o. female with SBO and NG in place. Discussed with Dr. Wynetta Emery and Quinn Axe NP Palliative. Discussed that patient's family is leaning towards palliative. Discussed option of SBFT to see if diagnostic/ therapeutic. Family is good with this plan but reluctant for any surgery.  SBFT NG in and NPO Repeat Xray at 51 Will let RN know results and decide about tube at that time    All questions were answered to the satisfaction of the patient. Palliative has been in contact with family at this time.   Virl Cagey 03/21/2021, 10:29 AM

## 2021-03-21 NOTE — Progress Notes (Signed)
Per Dr. Constance Haw patient NG tube can be discontinued. NG tube pulled out with no problems. Full length of tube was pulled. Patient tolerated this well.

## 2021-03-21 NOTE — Consult Note (Signed)
Consultation Note Date: 03/21/2021   Patient Name: Courtney Oliver  DOB: 1927/07/05  MRN: [id number]  Age / Sex: 87 y.o., female  PCP: Manon Hilding, MD Referring Physician: Murlean Iba, MD  Reason for Consultation: Establishing goals of care and Psychosocial/spiritual support  HPI/Patient Profile: 87 y.o. female  with past medical history of HTN/HLD, 3 prior bowel obstructions, rectal mass noted on imaging at [facility] ER with no work-up admitted on 03/20/2021 with small bowel obstruction.   Clinical Assessment and Goals of Care: I have reviewed medical records including EPIC notes, labs and imaging, received report from RN, assessed the patient.  Mrs. Courtney Oliver is lying in the bed in her room.  She greets me making and mostly keeping eye contact.  She is alert, oriented to person and situation.  Initially, she tells me that we are at [facility], but when I tell her that we are in [place], she says oh we are at [facility].  She denies issues or concerns, pain, at this time.  There is no family at bedside at this time.  Conference with bedside nursing staff related to patient condition and needs.    I return later to find son, Courtney Oliver at bedside.  We briefly talk about his mother's acute health concerns, her functional status, and her history of small bowel obstruction.  Daughter, Courtney Oliver is not present at this time.  I share that I will come back at a later time so that we can have a family meeting.  Conference with surgeon related to patient condition.  Return later in the day and then met at the bedside along with Courtney Oliver and Courtney Oliver to discuss diagnosis prognosis, GOC, EOL wishes, disposition and options.   I introduced Palliative Medicine as specialized medical care for people living with serious illness. It focuses on providing relief from the symptoms and stress of a serious illness. The goal is to  improve quality of life for both the patient and the family.  We talked about Mrs. Storck's functional status at home.  She has limited mobility, moves from the bed to the chair and back to the bed.  She is unable to complete ADLs, family visits 3 times a day to make and bring her meals.  She has a private pay aide that comes daily between 1030 and 130.  They share that she is incontinent of bowel and bladder, but is unaware of her incontinence.  We talked about Mrs. Mase's acute health concern.  Family discusses the declines that they have seen over the last year, last few months in particular.  They are able to accurately name and describe her acute concerns and abilities.  I attempted to elicit values and goals of care important to the patient.  Although Mrs. Delarosa is oriented to person, place and situation, she repeatedly defers decision-making to her children.  During our discussion she does state, "it is not worth it" when talking about surgical intervention.  The difference between aggressive medical intervention and comfort care  was considered in light of the patient's goals of care.  We talked about some "what if's and maybe's".  Family is appreciative of the opportunity for SBFT, and awaiting results.  They share that if Mrs. Mussa's bowel obstruction is relieved, they anticipate taking her home with hospice care, transitioning to residential hospice when she gets sick again, do not rehospitalize.  They share that if SBFT is not effective, at this point, they are leaning against surgical intervention, but feel that they need time to think about, pray about choices for Ms. Lema.  They share that this is difficult because although she has limited functional status and abilities, she is still to a certain extent oriented.  Hospice Care services outpatient were explained and offered.  We talked about what is and is not provided at residential hospice and with at home hospice care.  Family states that  their choice would be [facility] in [place].  I share that [facility] would have to evaluate her and agree that she has limited time, 6 weeks or less, before they will accept her.  I shared that if she is not excepted into residential hospice she could go home with hospice care to transfer to residential hospice in the future.  Discussed the importance of continued conversation with family and the medical providers regarding overall plan of care and treatment options, ensuring decisions are within the context of the patient's values and GOCs.  Questions and concerns were addressed.  The family was encouraged to call with questions or concerns.  PMT will continue to support holistically.  Conference with attending, bedside nursing staff, transition of care team related to patient condition, needs, goals of care, disposition. PMT to have a family meeting 6/24 at 40 AM at bedside.  HCPOA HCPOA -daughter Courtney Oliver and son Courtney Oliver are listed as healthcare surrogates.    SUMMARY OF RECOMMENDATIONS   At this point continue to treat the treatable but no CPR or intubation. Leaning against small bowel obstruction surgery Considering hospice care PMT to follow-up 6/24 at 10 AM  Code Status/Advance Care Planning: DNR  Symptom Management:  Per hospitalist, no additional needs at this time.  Palliative Prophylaxis:  Frequent Pain Assessment and Oral Care  Additional Recommendations (Limitations, Scope, Preferences): Continue to treat the treatable but no CPR or intubation  Psycho-social/Spiritual:  Desire for further Chaplaincy support:no Additional Recommendations: Caregiving  Support/Resources and Education on Hospice  Prognosis:  Unable to determine, based on outcomes.  6 months or less would not be surprising based on recurrent bowel obstruction (4), memory loss, functional status.  Discharge Planning:  To be determined, based on outcomes.       Primary Diagnoses: Present on  Admission:  SBO (small bowel obstruction) ([place])  Hypertension  Hypercholesterolemia  Thyroid disease   I have reviewed the medical record, interviewed the patient and family, and examined the patient. The following aspects are pertinent.  Past Medical History:  Diagnosis Date   Gout    Hyperglycemia    Hyperlipidemia    Hypertension    Social History   Socioeconomic History   Marital status: Widowed    Spouse name: Not on file   Number of children: Not on file   Years of education: Not on file   Highest education level: Not on file  Occupational History   Not on file  Tobacco Use   Smoking status: Former    Pack years: 0.00    Types: Cigarettes   Smokeless tobacco: Never  Vaping Use   Vaping Use: Never used  Substance and Sexual Activity   Alcohol use: Never   Drug use: Never   Sexual activity: Not Currently  Other Topics Concern   Not on file  Social History Narrative   Lives in independent retirement community. Has a caregiver for a few hours around lunch time and then again at bedtime. 3 Children are in an out to check on her also.   Social Determinants of Health   Financial Resource Strain: Not on file  Food Insecurity: No Food Insecurity   Worried About Charity fundraiser in the Last Year: Never true   Ran Out of Food in the Last Year: Never true  Transportation Needs: No Transportation Needs   Lack of Transportation (Medical): No   Lack of Transportation (Non-Medical): No  Physical Activity: Not on file  Stress: Not on file  Social Connections: Not on file   Family History  Problem Relation Age of Onset   Heart attack Father    Scheduled Meds:  heparin  5,000 Units Subcutaneous Q8H   Continuous Infusions:  sodium chloride 100 mL/hr at 03/20/21 2014   PRN Meds:.acetaminophen **OR** acetaminophen, hydrALAZINE, morphine injection, ondansetron **OR** ondansetron (ZOFRAN) IV Medications Prior to Admission:  Prior to Admission medications    Medication Sig Start Date End Date Taking? Authorizing Provider  allopurinol (ZYLOPRIM) 300 MG tablet Take 300 mg by mouth daily. 07/21/17  Yes [provider]  atorvastatin (LIPITOR) 40 MG tablet Take 20 mg by mouth daily. 08/21/17  Yes [provider]  EUTHYROX 137 MCG tablet Take 137 mcg by mouth daily. 03/07/21  Yes [provider]  FEROSUL 325 (65 Fe) MG tablet Take 325 mg by mouth daily. 02/08/21  Yes [provider]  losartan (COZAAR) 100 MG tablet Take 1 tablet by mouth daily. 01/24/21  Yes [provider]  nitroGLYCERIN (NITROSTAT) 0.4 MG SL tablet Place 0.4 mg under the tongue every 5 (five) minutes as needed for chest pain. 08/06/17  Yes [provider]  pantoprazole (PROTONIX) 40 MG tablet Take 1 tablet by mouth daily. 02/08/21  Yes [provider]   No Known Allergies Review of Systems  Unable to perform ROS: Age   Physical Exam Vitals and nursing note reviewed.  Constitutional:      General: She is not in acute distress.    Appearance: She is not ill-appearing.  HENT:     Head: Normocephalic and atraumatic.     Mouth/Throat:     Mouth: Mucous membranes are moist.  Cardiovascular:     Rate and Rhythm: Normal rate.  Pulmonary:     Effort: Pulmonary effort is normal. No respiratory distress.  Abdominal:     Palpations: Abdomen is soft.     Tenderness: There is no abdominal tenderness. There is no guarding.  Skin:    General: Skin is warm and dry.  Neurological:     Mental Status: She is alert.     Comments: Noted memory loss, oriented to person place and situation  Psychiatric:        Mood and Affect: Mood normal.        Behavior: Behavior normal.     Comments: Calm, not fearful    Vital Signs: BP (!) 101/59 (BP Location: Left Arm)   Pulse 93   Temp 97.8 F (36.6 C) (Oral)   Resp 18   Ht 5' 2"  (1.575 m)   Wt 63.9 kg   SpO2 95%  BMI 25.77 kg/m  Pain Scale: 0-10   Pain Score: 0-No pain   SpO2:  SpO2: 95 % O2 Device:SpO2: 95 % O2 Flow Rate: .   IO: Intake/output summary:  Intake/Output Summary (Last 24 hours) at 03/21/2021 5997 Last data filed at 03/20/2021 1818 Gross per 24 hour  Intake 1814.85 ml  Output --  Net 1814.85 ml    LBM:   Baseline Weight: Weight: 64.9 kg Most recent weight: Weight: 63.9 kg     Palliative Assessment/Data:   Flowsheet Rows    Flowsheet Row Most Recent Value  Intake Tab   Referral Department Hospitalist  Unit at Time of Referral Med/Surg Unit  Palliative Care Primary Diagnosis Other (Comment)  Date Notified 03/20/21  Palliative Care Type New Palliative care  Reason for referral Clarify Goals of Care  Date of Admission 03/20/21  Date first seen by Palliative Care 03/21/21  # of days Palliative referral response time 1 Day(s)  # of days IP prior to Palliative referral 0  Clinical Assessment   Palliative Performance Scale Score 40%  Pain Max last 24 hours Not able to report  Pain Min Last 24 hours Not able to report  Dyspnea Max Last 24 Hours Not able to report  Dyspnea Min Last 24 hours Not able to report  Psychosocial & Spiritual Assessment   Palliative Care Outcomes        Time In: 0950 Time Out: 1110 Time Total: 80 minutes  Greater than 50%  of this time was spent counseling and coordinating care related to the above assessment and plan.  Signed by: Drue Novel, NP   Please contact Palliative Medicine Team phone at [phone] for questions and concerns.  For individual provider: See Shea Evans

## 2021-03-21 NOTE — Progress Notes (Signed)
PROGRESS NOTE   Courtney Oliver  BOF:[id number] DOB: [birth year] DOA: 03/20/2021 PCP: Manon Hilding, MD   Chief Complaint  Patient presents with   Nausea   Level of care: Med-Surg  Brief Admission History:  87 y.o. female,with history of three prior bowel obstructions, HTN, and HLD presents to the ED with a chief complaint of nausea and vomiting. She admits that her memory is not as clear as it used to be. She thinks that the nausea and vomiting started this morning. She had approximately 3-4 episodes of non bloody emesis.  She reports no associated abdominal pain.  She does think her belly is a little bloated.  Her last normal bowel movement was the day prior to admission.  Her last normal meal was breakfast the day of admission.  She reports no fevers or dysuria, and no other symptoms.  She reports that otherwise she has been in her normal state of health.  It is important to note that in July 2021 patient did have her first bowel obstruction.  It was determined to most likely be secondary to adhesions.  Patient has had surgery for colon cancer, hysterectomy, and appendectomy in the past.  She was in the hospital for 11 days at that time at  [facility].  She did well for almost a year, and then in May of this year she went into the hospital again for bowel obstruction and sepsis.  Sepsis at that time was secondary to UTI.  She was discharged home and 2 weeks later back in the hospital for bowel obstruction again.  She has been back home for almost a month.  Patient lives alone, ambulates with walker, and has a caretaker that checks on her from 1030 to 1:30 PM every day.  Daughter would like it to be known that a rectal mass was noted on imaging at [facility], but the patient decided not to do a colonoscopy or further work-up of this mass.   Assessment & Plan:   Principal Problem:   SBO (small bowel obstruction) (HCC) Active Problems:   Hypercholesterolemia   Hypertension   Thyroid  disease  Recurrent SBO - Pt seems to be responding well to NG tube bowel rest and supportive measures.  Surgery ordered SBFT study today.  Appreciate surgery recommendations.    Essential hypertension - IV meds ordered while NPO.  Holding home oral medication.  HLD - temporarily holding home statin while NPO.   Hypothyrodism - stable on levothyroxine  Fall at home - daughter requesting palliative consult for goals of care discussion.    DNR present on admission - continue DNR order in hospital.   DVT prophylaxis: SCD/heparin  Code Status: DNR  Family Communication: daughter at bedside  Disposition: TBD Status is: Inpatient  Not inpatient appropriate, will call UM team and downgrade to OBS.   Dispo: The patient is from: Home              Anticipated d/c is to: Home              Patient currently is not medically stable to d/c.   Difficult to place patient No  Consultants:  Surgery   Procedures:  NG placement   Antimicrobials:  N/a  Subjective: Pt reports feeling well but daughter says "mother says that all the time"  Objective: Vitals:   03/20/21 2130 03/20/21 2241 03/21/21 0244 03/21/21 0729  BP: 100/60 137/82 121/70 (!) 101/59  Pulse: 78 (!) 110 98 93  Resp: 17  17 18 18   Temp:  97.7 F (36.5 C) 97.9 F (36.6 C) 97.8 F (36.6 C)  TempSrc:  Oral Oral Oral  SpO2: 97% 97% 94% 95%  Weight:  63.9 kg    Height:  5\' 2"  (1.575 m)      Intake/Output Summary (Last 24 hours) at 03/21/2021 1039 Last data filed at 03/20/2021 1818 Gross per 24 hour  Intake 1814.85 ml  Output --  Net 1814.85 ml   Filed Weights   03/20/21 1432 03/20/21 2241  Weight: 64.9 kg 63.9 kg    Examination:  General exam: elderly female, awake, alert, cooperative, Appears calm and comfortable  Respiratory system: Clear to auscultation. Respiratory effort normal. Cardiovascular system: normal S1 & S2 heard. No JVD, murmurs, rubs, gallops or clicks. No pedal edema. Gastrointestinal system:  Abdomen is mildly distended but, soft and nontender. No organomegaly or masses felt. Hypoactive BS. Central nervous system: Alert and oriented. No focal neurological deficits. Extremities: Symmetric 5 x 5 power. Skin: No rashes, lesions or ulcers Psychiatry: Judgement and insight appear normal. Mood & affect appropriate.   Data Reviewed: I have personally reviewed following labs and imaging studies  CBC: Recent Labs  Lab 03/20/21 1549 03/21/21 0407  WBC 7.3 5.7  NEUTROABS 5.4 3.6  HGB 11.7* 10.2*  HCT 36.8 32.9*  MCV 92.5 93.2  PLT 321 614    Basic Metabolic Panel: Recent Labs  Lab 03/20/21 1549 03/21/21 0407  NA 133* 133*  K 4.4 4.5  CL 101 102  CO2 23 24  GLUCOSE 130* 128*  BUN 19 20  CREATININE 0.90 0.94  CALCIUM 8.9 8.3*  MG  --  1.9    GFR: Estimated Creatinine Clearance: 33.5 mL/min (by C-G formula based on SCr of 0.94 mg/dL).  Liver Function Tests: Recent Labs  Lab 03/20/21 1549 03/21/21 0407  AST 15 12*  ALT 11 9  ALKPHOS 108 91  BILITOT 0.7 0.5  PROT 6.0* 5.1*  ALBUMIN 3.2* 2.6*    CBG: No results for input(s): GLUCAP in the last 168 hours.  Recent Results (from the past 240 hour(s))  Resp Panel by RT-PCR (Flu A&B, Covid) Nasopharyngeal Swab     Status: None   Collection Time: 03/20/21  6:28 PM   Specimen: Nasopharyngeal Swab; Nasopharyngeal(NP) swabs in vial transport medium  Result Value Ref Range Status   SARS Coronavirus 2 by RT PCR NEGATIVE NEGATIVE Final    Comment: (NOTE) SARS-CoV-2 target nucleic acids are NOT DETECTED.  The SARS-CoV-2 RNA is generally detectable in upper respiratory specimens during the acute phase of infection. The lowest concentration of SARS-CoV-2 viral copies this assay can detect is 138 copies/mL. A negative result does not preclude SARS-Cov-2 infection and should not be used as the sole basis for treatment or other patient management decisions. A negative result may occur with  improper specimen  collection/handling, submission of specimen other than nasopharyngeal swab, presence of viral mutation(s) within the areas targeted by this assay, and inadequate number of viral copies(<138 copies/mL). A negative result must be combined with clinical observations, patient history, and epidemiological information. The expected result is Negative.  Fact Sheet for Patients:  EntrepreneurPulse.com.au  Fact Sheet for Healthcare Providers:  IncredibleEmployment.be  This test is no t yet approved or cleared by the [place] FDA and  has been authorized for detection and/or diagnosis of SARS-CoV-2 by FDA under an Emergency Use Authorization (EUA). This EUA will remain  in effect (meaning this test can be used) for the duration  of the COVID-19 declaration under Section 564(b)(1) of the Act, 21 U.S.C.section 360bbb-3(b)(1), unless the authorization is terminated  or revoked sooner.       Influenza A by PCR NEGATIVE NEGATIVE Final   Influenza B by PCR NEGATIVE NEGATIVE Final    Comment: (NOTE) The Xpert Xpress SARS-CoV-2/FLU/RSV plus assay is intended as an aid in the diagnosis of influenza from Nasopharyngeal swab specimens and should not be used as a sole basis for treatment. Nasal washings and aspirates are unacceptable for Xpert Xpress SARS-CoV-2/FLU/RSV testing.  Fact Sheet for Patients: EntrepreneurPulse.com.au  Fact Sheet for Healthcare Providers: IncredibleEmployment.be  This test is not yet approved or cleared by the [place] FDA and has been authorized for detection and/or diagnosis of SARS-CoV-2 by FDA under an Emergency Use Authorization (EUA). This EUA will remain in effect (meaning this test can be used) for the duration of the COVID-19 declaration under Section 564(b)(1) of the Act, 21 U.S.C. section 360bbb-3(b)(1), unless the authorization is terminated or revoked.  Performed at [facility], [address] [place], [place] [address]      Radiology Studies: Abd 1 View (KUB)  Result Date: 03/21/2021 CLINICAL DATA:  NG tube.  Shortness of breath. EXAM: ABDOMEN - 1 VIEW COMPARISON:  Chest x-ray 03/20/2021.  CT 03/20/2021. FINDINGS: NG tube has been advanced. Its tip is coiled in the stomach. Persistent prominently dilated loops of bowel again noted. No free air identified. Contrast from prior CT noted the bladder. Visceral atherosclerotic vascular calcification. Degenerative changes and scoliosis lumbar spine. Degenerative changes both hips. IMPRESSION: NG tube has been advanced. Its tip is coiled in stomach. Persistent prominent dilated loops of bowel again noted Electronically Signed   By: Marcello Moores  Register   On: 03/21/2021 06:20   CT Abdomen Pelvis W Contrast  Result Date: 03/20/2021 CLINICAL DATA:  Small bowel obstruction. EXAM: CT ABDOMEN AND PELVIS WITH CONTRAST TECHNIQUE: Multidetector CT imaging of the abdomen and pelvis was performed using the standard protocol following bolus administration of intravenous contrast. CONTRAST:  14mL OMNIPAQUE IOHEXOL 300 MG/ML  SOLN COMPARISON:  CT 02/27/2021 FINDINGS: Lower chest: Lung bases are clear. Hepatobiliary: No focal hepatic lesion. No biliary duct dilatation. Common bile duct is normal. New free fluid over the paraspinal liver (image 17/2. Pancreas: Pancreas is normal. No ductal dilatation. No pancreatic inflammation. Spleen: Normal spleen Adrenals/urinary tract: Adrenal glands and kidneys are normal. The ureters and bladder normal. Stomach/Bowel: Small hiatal hernia. Stomach appears normal. Duodenum is normal. Most proximal small bowel is normal. The mid ileum is significantly dilated with fecalized internal contents. For example the mid small bowel measure up to 4.2 cm on image 44/2. There are long segments of distended small bowel. There is an apparent transition point noted on coronal image 37/5 with transition from stool-filled  small bowel to decompression. There is more distal dilatation downstream of this transition which suggest multifocal process. The small bowel is collapsed leading up to the colon. The colon is predominantly collapsed. Mild inflammation through the sigmoid colon rectum (image 62/2. Vascular/Lymphatic: Abdominal aorta is normal caliber. No periportal or retroperitoneal adenopathy. No pelvic adenopathy. Reproductive: Post hysterectomy Other: No intraperitoneal free air. Musculoskeletal: No aggressive osseous lesion. IMPRESSION: 1. Small bowel obstruction pattern. Significantly dilated loops of small bowel with fecalized internal contents. Potential several regions of small-bowel transition which would suggest adhesions. 2. No intraperitoneal free air, pneumatosis or portal venous gas. 3. New free fluid along the bare space of the liver. 4. Inflammation through the rectosigmoid colon  similar comparison exam 5. Recommend surgical consultation small bowel obstruction. Electronically Signed   By: Suzy Bouchard M.D.   On: 03/20/2021 18:25   DG Chest Port 1 View  Result Date: 03/20/2021 CLINICAL DATA:  Check gastric catheter placement EXAM: PORTABLE CHEST 1 VIEW COMPARISON:  02/25/2021 FINDINGS: Gastric catheter is noted with the tip in the stomach. Proximal side port lies at the gastroesophageal junction. This should be advanced several cm deeper into the stomach. IMPRESSION: Gastric catheter as described. Further advancement into the stomach is recommended. Electronically Signed   By: Inez Catalina M.D.   On: 03/20/2021 21:09    Scheduled Meds:  diatrizoate meglumine-sodium  90 mL Per NG tube Once   heparin  5,000 Units Subcutaneous Q8H   Continuous Infusions:  sodium chloride 100 mL/hr at 03/20/21 2014     LOS: 1 day   Time spent: 37 mins   Damari Suastegui Wynetta Emery, MD How to contact the [facility] Attending or Consulting provider [place] or covering provider during after hours [place], for this patient?  Check the  care team in [facility] and look for a) attending/consulting [facility] provider listed and b) the [facility] team listed Log into [email or web address] and use [place] universal password to access. If you do not have the password, please contact the hospital operator. Locate the [facility] provider you are looking for under [facility] and page to a number that you can be directly reached. If you still have difficulty reaching the provider, please page the [facility] (Director on Call) for the Hospitalists listed on amion for assistance.  03/21/2021, 10:39 AM

## 2021-03-21 NOTE — Progress Notes (Signed)
[facility]   Patient with bms today And SBFT with contrast to the colon.  Discontinue NG and can have sips.   Curlene Labrum, MD

## 2021-03-22 DIAGNOSIS — K56609 Unspecified intestinal obstruction, unspecified as to partial versus complete obstruction: Secondary | ICD-10-CM

## 2021-03-22 MED ORDER — LEVOTHYROXINE SODIUM 137 MCG PO TABS
137.0000 ug | ORAL_TABLET | Freq: Every day | ORAL | Status: DC
  Administered 2021-03-22 – 2021-03-23 (×2): 137 ug via ORAL
  Filled 2021-03-22 (×2): qty 1

## 2021-03-22 MED ORDER — ALLOPURINOL 300 MG PO TABS
300.0000 mg | ORAL_TABLET | Freq: Every day | ORAL | Status: DC
  Administered 2021-03-22 – 2021-03-23 (×2): 300 mg via ORAL
  Filled 2021-03-22 (×2): qty 1

## 2021-03-22 MED ORDER — PANTOPRAZOLE SODIUM 40 MG PO TBEC
40.0000 mg | DELAYED_RELEASE_TABLET | Freq: Every day | ORAL | Status: DC
  Administered 2021-03-22 – 2021-03-23 (×2): 40 mg via ORAL
  Filled 2021-03-22 (×2): qty 1

## 2021-03-22 MED ORDER — MORPHINE SULFATE (PF) 2 MG/ML IV SOLN: 1.0000 mg | INTRAVENOUS | Status: DC | PRN

## 2021-03-22 MED ORDER — ATORVASTATIN CALCIUM 20 MG PO TABS
20.0000 mg | ORAL_TABLET | Freq: Every evening | ORAL | Status: DC
  Administered 2021-03-22: 20 mg via ORAL
  Filled 2021-03-22: qty 1

## 2021-03-22 NOTE — Progress Notes (Addendum)
Palliative: Courtney Oliver is lying quietly in bed.  She is alert, oriented to person, place and situation, but with known dementia.  She is known for confabulation.  She is pleasant, and cooperative.  She denies complaints at this time, telling me that she is feeling a little better.  Her daughter, Courtney Oliver, is at bedside.  Courtney Oliver gets her brother, Courtney Oliver son, Courtney Oliver, on for a phone visit.  We talked at length about Courtney Oliver's acute health concern of small bowel obstruction.  I shared that it seems that the treatment plan has worked to unblock Courtney Oliver this time.  We talked at length about recurrent bowel obstruction and the treatment options.  Family shares that neither Courtney Oliver nor they want her to have NG placed again for bowel obstruction.  They share that she really struggles with this and they feel that it does not change her overall health issues including, but not limited to, memory loss, poor mobility, incontinence of bowel and bladder.  We talked at length about at home and residential hospice care what is provided at each.  Provider choice offered, family chooses hospice of [place].  We talked about equipment needs, at this point family would request a hospital bed.  We also talked about potty chair, bedpan, and other equipment needs.  I encourage family to lean on hospice for any needs.  We talked about a few "what if's and maybe's".  We review Courtney Oliver list talking about what medicines they can unburden her from.  I again encouraged them to lean on hospice for support.  We talked about transitioning to residential hospice when Courtney Oliver gets sick again.  Family shares that transitioning to comfort care would be their preference.  Conference with attending, bedside nursing staff, transition of care team related to patient condition, needs, goals of care. [place] form/DNR completed and placed on chart.  Plan:   Home with the benefits of hospice of [facility].  Transition to residential hospice when appropriate.  Do not rehospitalize. Prognosis: 2 to 3 months at most would not be surprising.  Once bowel obstruction reoccurs, days to weeks likely.  65 minutes, extended time Quinn Axe, NP Palliative medicine team Team phone [phone] Greater than 50% of this time was spent counseling and coordinating care related to the above assessment and plan.

## 2021-03-22 NOTE — Plan of Care (Signed)
  Problem: Education: Goal: Knowledge of General Education information will improve Description: Including pain rating scale, medication(s)/side effects and non-pharmacologic comfort measures 03/22/2021 1412 by Melony Overly, RN Outcome: Progressing 03/22/2021 1412 by Melony Overly, RN Outcome: Progressing   Problem: Health Behavior/Discharge Planning: Goal: Ability to manage health-related needs will improve 03/22/2021 1412 by Melony Overly, RN Outcome: Not Progressing 03/22/2021 1412 by Melony Overly, RN Outcome: Progressing   Problem: Clinical Measurements: Goal: Ability to maintain clinical measurements within normal limits will improve 03/22/2021 1412 by Melony Overly, RN Outcome: Progressing 03/22/2021 1412 by Melony Overly, RN Outcome: Progressing Goal: Will remain free from infection 03/22/2021 1412 by Melony Overly, RN Outcome: Progressing 03/22/2021 1412 by Melony Overly, RN Outcome: Progressing Goal: Diagnostic test results will improve 03/22/2021 1412 by Melony Overly, RN Outcome: Progressing 03/22/2021 1412 by Melony Overly, RN Outcome: Progressing Goal: Respiratory complications will improve 03/22/2021 1412 by Melony Overly, RN Outcome: Progressing 03/22/2021 1412 by Melony Overly, RN Outcome: Progressing Goal: Cardiovascular complication will be avoided 03/22/2021 1412 by Melony Overly, RN Outcome: Progressing 03/22/2021 1412 by Melony Overly, RN Outcome: Progressing   Problem: Activity: Goal: Risk for activity intolerance will decrease 03/22/2021 1412 by Melony Overly, RN Outcome: Progressing 03/22/2021 1412 by Melony Overly, RN Outcome: Progressing   Problem: Nutrition: Goal: Adequate nutrition will be maintained 03/22/2021 1412 by Melony Overly, RN Outcome: Not Progressing 03/22/2021 1412 by Melony Overly, RN Outcome: Progressing   Problem: Coping: Goal: Level of anxiety will decrease 03/22/2021 1412 by Melony Overly,  RN Outcome: Progressing 03/22/2021 1412 by Melony Overly, RN Outcome: Progressing   Problem: Elimination: Goal: Will not experience complications related to bowel motility 03/22/2021 1412 by Melony Overly, RN Outcome: Not Progressing 03/22/2021 1412 by Melony Overly, RN Outcome: Progressing Goal: Will not experience complications related to urinary retention 03/22/2021 1412 by Melony Overly, RN Outcome: Progressing 03/22/2021 1412 by Melony Overly, RN Outcome: Progressing   Problem: Pain Managment: Goal: General experience of comfort will improve 03/22/2021 1412 by Melony Overly, RN Outcome: Progressing 03/22/2021 1412 by Melony Overly, RN Outcome: Progressing   Problem: Safety: Goal: Ability to remain free from injury will improve 03/22/2021 1412 by Melony Overly, RN Outcome: Progressing 03/22/2021 1412 by Melony Overly, RN Outcome: Progressing   Problem: Skin Integrity: Goal: Risk for impaired skin integrity will decrease 03/22/2021 1412 by Melony Overly, RN Outcome: Progressing 03/22/2021 1412 by Melony Overly, RN Outcome: Progressing

## 2021-03-22 NOTE — Progress Notes (Signed)
PROGRESS NOTE   Courtney Oliver  FXT:[id number] DOB: Feb 10, 1927 DOA: 03/20/2021 PCP: Manon Hilding, MD   Chief Complaint  Patient presents with   Nausea   Level of care: Med-Surg  Brief Admission History:  87 y.o. female,with history of three prior bowel obstructions, HTN, and HLD presents to the ED with a chief complaint of nausea and vomiting. She admits that her memory is not as clear as it used to be. She thinks that the nausea and vomiting started this morning. She had approximately 3-4 episodes of non bloody emesis.  She reports no associated abdominal pain.  She does think her belly is a little bloated.  Her last normal bowel movement was the day prior to admission.  Her last normal meal was breakfast the day of admission.  She reports no fevers or dysuria, and no other symptoms.  She reports that otherwise she has been in her normal state of health.  It is important to note that in July 2021 patient did have her first bowel obstruction.  It was determined to most likely be secondary to adhesions.  Patient has had surgery for colon cancer, hysterectomy, and appendectomy in the past.  She was in the hospital for 11 days at that time at [facility].  She did well for almost a year, and then in May of this year she went into the hospital again for bowel obstruction and sepsis.  Sepsis at that time was secondary to UTI.  She was discharged home and 2 weeks later back in the hospital for bowel obstruction again.  She has been back home for almost a month.  Patient lives alone, ambulates with walker, and has a caretaker that checks on her from 1030 to 1:30 PM every day.  Daughter would like it to be known that a rectal mass was noted on imaging at [facility], but the patient decided not to do a colonoscopy or further work-up of this mass.  Assessment & Plan:   Principal Problem:   SBO (small bowel obstruction) (HCC) Active Problems:   Hypercholesterolemia   Hypertension   Thyroid  disease  Recurrent SBO - Resolved now.  Pt responded to NG tube bowel rest and supportive measures.  Surgery ordered SBFT and that seemed to relieve the obstruction.  After palliative discussions, decision was made to pursue home with hosp[facility] [place].  Do not rehospitalize.  No surgery and no further NG placement.  Appreciate surgery recommendations.    Essential hypertension - resume home meds when needed for now BP is holding well without.  HLD - resume home med at discharge.   Hypothyrodism - stable on levothyroxine  Fall at home - daughter requesting palliative consult for goals of care discussion.    DNR present on admission - continue DNR order in hospital.   DVT prophylaxis: SCD/heparin  Code Status: DNR  Family Communication: daughter at bedside  Disposition: TBD Status is: Inpatient  Remains inpatient appropriate because:IV treatments appropriate due to intensity of illness or inability to take PO and Inpatient level of care appropriate due to severity of illness  Dispo: The patient is from: Home              Anticipated d/c is to: Home              Patient currently is not medically stable to d/c.   Difficult to place patient No  Consultants:  Surgery   Procedures:  NG placement   Antimicrobials:  N/a  Subjective: Pt tolerating diet well and having BMs.   Objective: Vitals:   03/21/21 1325 03/21/21 2013 03/22/21 0550 03/22/21 1414  BP: 115/62 113/68 100/61 (!) 91/57  Pulse: 97 (!) 108 94 87  Resp: 18 17 18 18   Temp: 98.1 F (36.7 C) (!) 97.5 F (36.4 C) 97.8 F (36.6 C) 97.9 F (36.6 C)  TempSrc: Oral Oral Oral Oral  SpO2: 100% 99% 95% 99%  Weight:      Height:        Intake/Output Summary (Last 24 hours) at 03/22/2021 1650 Last data filed at 03/22/2021 1300 Gross per 24 hour  Intake 480 ml  Output 750 ml  Net -270 ml   Filed Weights   03/20/21 1432 03/20/21 2241  Weight: 64.9 kg 63.9 kg    Examination:  General exam: elderly  female, awake, alert, cooperative, Appears calm and comfortable  Respiratory system: Clear to auscultation. Respiratory effort normal. Cardiovascular system: normal S1 & S2 heard. No JVD, murmurs, rubs, gallops or clicks. No pedal edema. Gastrointestinal system: Abdomen is mildly distended but, soft and nontender. No organomegaly or masses felt. Hypoactive BS. Central nervous system: Alert and oriented. No focal neurological deficits. Extremities: Symmetric 5 x 5 power. Skin: No rashes, lesions or ulcers Psychiatry: Judgement and insight appear normal. Mood & affect appropriate.   Data Reviewed: I have personally reviewed following labs and imaging studies  CBC: Recent Labs  Lab 03/20/21 1549 03/21/21 0407  WBC 7.3 5.7  NEUTROABS 5.4 3.6  HGB 11.7* 10.2*  HCT 36.8 32.9*  MCV 92.5 93.2  PLT 321 297    Basic Metabolic Panel: Recent Labs  Lab 03/20/21 1549 03/21/21 0407  NA 133* 133*  K 4.4 4.5  CL 101 102  CO2 23 24  GLUCOSE 130* 128*  BUN 19 20  CREATININE 0.90 0.94  CALCIUM 8.9 8.3*  MG  --  1.9    GFR: Estimated Creatinine Clearance: 33.5 mL/min (by C-G formula based on SCr of 0.94 mg/dL).  Liver Function Tests: Recent Labs  Lab 03/20/21 1549 03/21/21 0407  AST 15 12*  ALT 11 9  ALKPHOS 108 91  BILITOT 0.7 0.5  PROT 6.0* 5.1*  ALBUMIN 3.2* 2.6*    CBG: No results for input(s): GLUCAP in the last 168 hours.  Recent Results (from the past 240 hour(s))  Resp Panel by RT-PCR (Flu A&B, Covid) Nasopharyngeal Swab     Status: None   Collection Time: 03/20/21  6:28 PM   Specimen: Nasopharyngeal Swab; Nasopharyngeal(NP) swabs in vial transport medium  Result Value Ref Range Status   SARS Coronavirus 2 by RT PCR NEGATIVE NEGATIVE Final    Comment: (NOTE) SARS-CoV-2 target nucleic acids are NOT DETECTED.  The SARS-CoV-2 RNA is generally detectable in upper respiratory specimens during the acute phase of infection. The lowest concentration of SARS-CoV-2  viral copies this assay can detect is 138 copies/mL. A negative result does not preclude SARS-Cov-2 infection and should not be used as the sole basis for treatment or other patient management decisions. A negative result may occur with  improper specimen collection/handling, submission of specimen other than nasopharyngeal swab, presence of viral mutation(s) within the areas targeted by this assay, and inadequate number of viral copies(<138 copies/mL). A negative result must be combined with clinical observations, patient history, and epidemiological information. The expected result is Negative.  Fact Sheet for Patients:  EntrepreneurPulse.com.au  Fact Sheet for Healthcare Providers:  IncredibleEmployment.be  This test is no t yet approved  or cleared by the [place] and  has been authorized for detection and/or diagnosis of SARS-CoV-2 by FDA under an Emergency Use Authorization (EUA). This EUA will remain  in effect (meaning this test can be used) for the duration of the COVID-19 declaration under Section 564(b)(1) of the Act, 21 U.S.C.section 360bbb-3(b)(1), unless the authorization is terminated  or revoked sooner.       Influenza A by PCR NEGATIVE NEGATIVE Final   Influenza B by PCR NEGATIVE NEGATIVE Final    Comment: (NOTE) The Xpert Xpress SARS-CoV-2/FLU/RSV plus assay is intended as an aid in the diagnosis of influenza from Nasopharyngeal swab specimens and should not be used as a sole basis for treatment. Nasal washings and aspirates are unacceptable for Xpert Xpress SARS-CoV-2/FLU/RSV testing.  Fact Sheet for Patients: EntrepreneurPulse.com.au  Fact Sheet for Healthcare Providers: IncredibleEmployment.be  This test is not yet approved or cleared by the [place] FDA and has been authorized for detection and/or diagnosis of SARS-CoV-2 by FDA under an Emergency Use Authorization  (EUA). This EUA will remain in effect (meaning this test can be used) for the duration of the COVID-19 declaration under Section 564(b)(1) of the Act, 21 U.S.C. section 360bbb-3(b)(1), unless the authorization is terminated or revoked.  Performed at [facility], [address], [place], [place] [address]      Radiology Studies: Abd 1 View (KUB)  Result Date: 03/21/2021 CLINICAL DATA:  NG tube.  Shortness of breath. EXAM: ABDOMEN - 1 VIEW COMPARISON:  Chest x-ray 03/20/2021.  CT 03/20/2021. FINDINGS: NG tube has been advanced. Its tip is coiled in the stomach. Persistent prominently dilated loops of bowel again noted. No free air identified. Contrast from prior CT noted the bladder. Visceral atherosclerotic vascular calcification. Degenerative changes and scoliosis lumbar spine. Degenerative changes both hips. IMPRESSION: NG tube has been advanced. Its tip is coiled in stomach. Persistent prominent dilated loops of bowel again noted Electronically Signed   By: Marcello Moores  Register   On: 03/21/2021 06:20   CT Abdomen Pelvis W Contrast  Result Date: 03/20/2021 CLINICAL DATA:  Small bowel obstruction. EXAM: CT ABDOMEN AND PELVIS WITH CONTRAST TECHNIQUE: Multidetector CT imaging of the abdomen and pelvis was performed using the standard protocol following bolus administration of intravenous contrast. CONTRAST:  158mL OMNIPAQUE IOHEXOL 300 MG/ML  SOLN COMPARISON:  CT 02/27/2021 FINDINGS: Lower chest: Lung bases are clear. Hepatobiliary: No focal hepatic lesion. No biliary duct dilatation. Common bile duct is normal. New free fluid over the paraspinal liver (image 17/2. Pancreas: Pancreas is normal. No ductal dilatation. No pancreatic inflammation. Spleen: Normal spleen Adrenals/urinary tract: Adrenal glands and kidneys are normal. The ureters and bladder normal. Stomach/Bowel: Small hiatal hernia. Stomach appears normal. Duodenum is normal. Most proximal small bowel is normal. The mid ileum is significantly  dilated with fecalized internal contents. For example the mid small bowel measure up to 4.2 cm on image 44/2. There are long segments of distended small bowel. There is an apparent transition point noted on coronal image 37/5 with transition from stool-filled small bowel to decompression. There is more distal dilatation downstream of this transition which suggest multifocal process. The small bowel is collapsed leading up to the colon. The colon is predominantly collapsed. Mild inflammation through the sigmoid colon rectum (image 62/2. Vascular/Lymphatic: Abdominal aorta is normal caliber. No periportal or retroperitoneal adenopathy. No pelvic adenopathy. Reproductive: Post hysterectomy Other: No intraperitoneal free air. Musculoskeletal: No aggressive osseous lesion. IMPRESSION: 1. Small bowel obstruction pattern. Significantly dilated loops of small  bowel with fecalized internal contents. Potential several regions of small-bowel transition which would suggest adhesions. 2. No intraperitoneal free air, pneumatosis or portal venous gas. 3. New free fluid along the bare space of the liver. 4. Inflammation through the rectosigmoid colon similar comparison exam 5. Recommend surgical consultation small bowel obstruction. Electronically Signed   By: Suzy Bouchard M.D.   On: 03/20/2021 18:25   DG Chest Port 1 View  Result Date: 03/20/2021 CLINICAL DATA:  Check gastric catheter placement EXAM: PORTABLE CHEST 1 VIEW COMPARISON:  02/25/2021 FINDINGS: Gastric catheter is noted with the tip in the stomach. Proximal side port lies at the gastroesophageal junction. This should be advanced several cm deeper into the stomach. IMPRESSION: Gastric catheter as described. Further advancement into the stomach is recommended. Electronically Signed   By: Inez Catalina M.D.   On: 03/20/2021 21:09   DG Abd Portable 1V-Small Bowel Obstruction Protocol-initial, 8 hr delay  Result Date: 03/21/2021 CLINICAL DATA:  8 hour delay small  bowel obstruction EXAM: PORTABLE ABDOMEN - 1 VIEW COMPARISON:  03/22/2019, CT 03/20/2021 FINDINGS: Esophageal tube tip overlies the proximal stomach. There is residual contrast within the urinary bladder. Enteral contrast is present in the colon. Diffuse gas-filled central small bowel borderline dilated loops up to 3 cm. IMPRESSION: Enteral contrast is present in the colon. Mild air distension of central small bowel. Electronically Signed   By: Donavan Foil M.D.   On: 03/21/2021 19:50    Scheduled Meds:  allopurinol  300 mg Oral Daily   atorvastatin  20 mg Oral QPM   heparin  5,000 Units Subcutaneous Q8H   levothyroxine  137 mcg Oral Daily   pantoprazole  40 mg Oral Daily   Continuous Infusions:  sodium chloride 100 mL/hr at 03/22/21 0812     LOS: 2 days   Time spent: 35 mins   Kirk Basquez Wynetta Emery, MD How to contact the [facility] Attending or Consulting provider [place] or covering provider during after hours [place], for this patient?  Check the care team in [facility] and look for a) attending/consulting TRH provider listed and b) the [facility] team listed Log into [email or web address] and use [place]'s universal password to access. If you do not have the password, please contact the hospital operator. Locate the [facility] provider you are looking for under [facility] and page to a number that you can be directly reached. If you still have difficulty reaching the provider, please page the [facility] (Director on Call) for the Hospitalists listed on amion for assistance.  03/22/2021, 4:50 PM

## 2021-03-22 NOTE — TOC Initial Note (Signed)
Transition of Care [facility]) - Initial/Assessment Note    Patient Details  Name: Courtney Oliver MRN: [id number] Date of Birth: 1927/08/11  Transition of [facility]) CM/SW Contact:    Shade Flood, LCSW Phone Number: 03/22/2021, 2:11 PM  Clinical Narrative:                  Pt admitted from home. Palliative APNP has consulted and pt/family would like hospice care at home at dc. Their preference for hospice agency is [facility]. Referred to Rudy at [facility]. They are arranging for a hospital bed for pt at home. Anticipating pt will dc home tomorrow with hospice care.  Weekend TOC will be available to assist.  Expected Discharge Plan: Home w Hospice Care Barriers to Discharge: Barriers Resolved   Patient Goals and CMS Choice Patient states their goals for this hospitalization and ongoing recovery are:: go home with hospice CMS Medicare.gov Compare Post Acute Care list provided to:: Patient Represenative (must comment) Choice offered to / list presented to : Adult Children  Expected Discharge Plan and Services Expected Discharge Plan: Home w Hospice Care In-house Referral: Clinical Social Work   Post Acute Care Choice: Hospice Living arrangements for the past 2 months: [place]                                      Prior Living Arrangements/Services Living arrangements for the past 2 months: Single Family Home Lives with:: Adult Children Patient language and need for interpreter reviewed:: Yes Do you feel safe going back to the place where you live?: Yes      Need for Family Participation in Patient Care: Yes (Comment) Care giver support system in place?: Yes (comment)   Criminal Activity/Legal Involvement Pertinent to Current Situation/Hospitalization: No - Comment as needed  Activities of Daily Living Home Assistive Devices/Equipment: Bedside commode/3-in-1, Cane (specify quad or straight), Eyeglasses, Walker (specify type), Wheelchair ADL  Screening (condition at time of admission) Patient's cognitive ability adequate to safely complete daily activities?: Yes Is the patient deaf or have difficulty hearing?: No Does the patient have difficulty seeing, even when wearing glasses/contacts?: No Does the patient have difficulty concentrating, remembering, or making decisions?: No Patient able to express need for assistance with ADLs?: No Does the patient have difficulty dressing or bathing?: No Independently performs ADLs?: Yes (appropriate for developmental age) Does the patient have difficulty walking or climbing stairs?: Yes Weakness of Legs: Both Weakness of Arms/Hands: Both  Permission Sought/Granted Permission sought to share information with : Facility Art therapist granted to share information with : Yes, Verbal Permission Granted     Permission granted to share info w AGENCY: [facility]        Emotional Assessment       Orientation: : Oriented to Self, Oriented to Place, Oriented to Situation, Oriented to  Time Alcohol / Substance Use: Not Applicable Psych Involvement: No (comment)  Admission diagnosis:  SBO (small bowel obstruction) ([place]) [K56.609] Patient Active Problem List   Diagnosis Date Noted   SBO (small bowel obstruction) ([place]) 03/20/2021   Cystitis 01/07/2016   Primary colorectal malignancy ([place]) 07/14/2011   Thyroid disease 07/14/2011   Gout 02/28/2011   Hypercholesterolemia 02/28/2011   Hypertension 02/28/2011   PCP:  Manon Hilding, MD Pharmacy:   [place] [place] Phone: [phone] Fax: [phone]     Social Determinants of Health (SDOH) Interventions    Readmission Risk Interventions No flowsheet data found.

## 2021-03-22 NOTE — Progress Notes (Signed)
[facility] Progress Note     Subjective: Had multiple Bms. Xray with contrast in colon. Tolerated sips last night. Somewhat distended now after full liquids but no nausea or pain.   Objective: Vital signs in last 24 hours: Temp:  [97.5 F (36.4 C)-98.1 F (36.7 C)] 97.8 F (36.6 C) (06/24 0550) Pulse Rate:  [94-108] 94 (06/24 0550) Resp:  [17-18] 18 (06/24 0550) BP: (100-115)/(61-68) 100/61 (06/24 0550) SpO2:  [95 %-100 %] 95 % (06/24 0550) Last BM Date: 03/21/21  Intake/Output from previous day: 06/23 0701 - 06/24 0700 In: 1893.2 [I.V.:1893.2] Out: 750 [Urine:300; Emesis/NG output:450] Intake/Output this shift: No intake/output data recorded.  General appearance: alert and no distress Resp: normal work of breathing GI: soft, mildly distended, nontender  Lab Results:  Recent Labs    03/20/21 1549 03/21/21 0407  WBC 7.3 5.7  HGB 11.7* 10.2*  HCT 36.8 32.9*  PLT 321 297   BMET Recent Labs    03/20/21 1549 03/21/21 0407  NA 133* 133*  K 4.4 4.5  CL 101 102  CO2 23 24  GLUCOSE 130* 128*  BUN 19 20  CREATININE 0.90 0.94  CALCIUM 8.9 8.3*   PT/INR No results for input(s): LABPROT, INR in the last 72 hours.  Studies/Results: Abd 1 View (KUB)  Result Date: 03/21/2021 CLINICAL DATA:  NG tube.  Shortness of breath. EXAM: ABDOMEN - 1 VIEW COMPARISON:  Chest x-ray 03/20/2021.  CT 03/20/2021. FINDINGS: NG tube has been advanced. Its tip is coiled in the stomach. Persistent prominently dilated loops of bowel again noted. No free air identified. Contrast from prior CT noted the bladder. Visceral atherosclerotic vascular calcification. Degenerative changes and scoliosis lumbar spine. Degenerative changes both hips. IMPRESSION: NG tube has been advanced. Its tip is coiled in stomach. Persistent prominent dilated loops of bowel again noted Electronically Signed   By: Marcello Moores  Register   On: 03/21/2021 06:20   CT Abdomen Pelvis W Contrast  Result Date:  03/20/2021 CLINICAL DATA:  Small bowel obstruction. EXAM: CT ABDOMEN AND PELVIS WITH CONTRAST TECHNIQUE: Multidetector CT imaging of the abdomen and pelvis was performed using the standard protocol following bolus administration of intravenous contrast. CONTRAST:  134mL OMNIPAQUE IOHEXOL 300 MG/ML  SOLN COMPARISON:  CT 02/27/2021 FINDINGS: Lower chest: Lung bases are clear. Hepatobiliary: No focal hepatic lesion. No biliary duct dilatation. Common bile duct is normal. New free fluid over the paraspinal liver (image 17/2. Pancreas: Pancreas is normal. No ductal dilatation. No pancreatic inflammation. Spleen: Normal spleen Adrenals/urinary tract: Adrenal glands and kidneys are normal. The ureters and bladder normal. Stomach/Bowel: Small hiatal hernia. Stomach appears normal. Duodenum is normal. Most proximal small bowel is normal. The mid ileum is significantly dilated with fecalized internal contents. For example the mid small bowel measure up to 4.2 cm on image 44/2. There are long segments of distended small bowel. There is an apparent transition point noted on coronal image 37/5 with transition from stool-filled small bowel to decompression. There is more distal dilatation downstream of this transition which suggest multifocal process. The small bowel is collapsed leading up to the colon. The colon is predominantly collapsed. Mild inflammation through the sigmoid colon rectum (image 62/2. Vascular/Lymphatic: Abdominal aorta is normal caliber. No periportal or retroperitoneal adenopathy. No pelvic adenopathy. Reproductive: Post hysterectomy Other: No intraperitoneal free air. Musculoskeletal: No aggressive osseous lesion. IMPRESSION: 1. Small bowel obstruction pattern. Significantly dilated loops of small bowel with fecalized internal contents. Potential several regions of small-bowel transition which would suggest adhesions.  2. No intraperitoneal free air, pneumatosis or portal venous gas. 3. New free fluid along  the bare space of the liver. 4. Inflammation through the rectosigmoid colon similar comparison exam 5. Recommend surgical consultation small bowel obstruction. Electronically Signed   By: Suzy Bouchard M.D.   On: 03/20/2021 18:25   DG Chest Port 1 View  Result Date: 03/20/2021 CLINICAL DATA:  Check gastric catheter placement EXAM: PORTABLE CHEST 1 VIEW COMPARISON:  02/25/2021 FINDINGS: Gastric catheter is noted with the tip in the stomach. Proximal side port lies at the gastroesophageal junction. This should be advanced several cm deeper into the stomach. IMPRESSION: Gastric catheter as described. Further advancement into the stomach is recommended. Electronically Signed   By: Inez Catalina M.D.   On: 03/20/2021 21:09   DG Abd Portable 1V-Small Bowel Obstruction Protocol-initial, 8 hr delay  Result Date: 03/21/2021 CLINICAL DATA:  8 hour delay small bowel obstruction EXAM: PORTABLE ABDOMEN - 1 VIEW COMPARISON:  03/22/2019, CT 03/20/2021 FINDINGS: Esophageal tube tip overlies the proximal stomach. There is residual contrast within the urinary bladder. Enteral contrast is present in the colon. Diffuse gas-filled central small bowel borderline dilated loops up to 3 cm. IMPRESSION: Enteral contrast is present in the colon. Mild air distension of central small bowel. Electronically Signed   By: Donavan Foil M.D.   On: 03/21/2021 19:50    Anti-infectives: Anti-infectives (From admission, onward)    None       Assessment/Plan: Ms. Hinchey is a 87 yo with SBO that is resolving. She can adv her diet as tolerated. SBO likely will recur. Home hospice recommended per families request given dementia.  No surgical intervention indicated or desired    LOS: 2 days    Virl Cagey 03/22/2021

## 2021-03-23 NOTE — Plan of Care (Signed)

## 2021-03-23 NOTE — Discharge Summary (Signed)
Physician Discharge Summary  Courtney Oliver PYP:[id number] DOB: 06/20/1927 DOA: 03/20/2021  PCP: Manon Hilding, MD  Admit date: 03/20/2021 Discharge date: 03/23/2021  Admitted From:  Home  Disposition: Home with hospice   Recommendations for Outpatient  Call Hospice nurse first for any problems, do no rehospitalize  Discharge Condition: HOSPICE  CODE STATUS: DNR DIET: soft foods and comfort foods    Brief Hospitalization Summary: Please see all hospital notes, images, labs for full details of the hospitalization. ADMISSION HPI: Courtney Oliver  is a 87 y.o. female,with history of three prior bowel obstructions, HTN, and HLD presents to the ED with a chief complaint of nausea and vomiting. She admits that her memory is not as clear as it used to be. She thinks that the nausea and vomiting started this morning. She had approximately 3-4 episodes of non bloody emesis.  She reports no associated abdominal pain.  She does think her belly is a little bloated.  Her last normal bowel movement was the day prior to admission.  Her last normal meal was breakfast the day of admission.  She reports no fevers or dysuria, and no other symptoms.  She reports that otherwise she has been in her normal state of health.  It is important to note that in July 2021 patient did have her first bowel obstruction.  It was determined to most likely be secondary to adhesions.  Patient has had surgery for colon cancer, hysterectomy, and appendectomy in the past.  She was in the hospital for 11 days at that time at [facility].  She did well for almost a year, and then in May of this year she went into the hospital again for bowel obstruction and sepsis.  Sepsis at that time was secondary to UTI.  She was discharged home and 2 weeks later back in the hospital for bowel obstruction again.  She has been back home for almost a month.  Patient lives alone, ambulates with walker, and has a caretaker that checks on her from 1030 to  1:30 PM every day.  Daughter would like it to be known that a rectal mass was noted on imaging at [facility], but the patient decided not to do a colonoscopy or further work-up of this mass.   Daughter is interested in getting more care for her mom.  She is also interested in talking about comfort care per patient.  Patient is DNR, and agreeable to palliative consult.   Patient does not smoke, does not drink, does not use illicit drugs.  Patient thinks she is vaccinated for COVID.   In the ED Patient is afebrile with a temperature 97.7, tachycardic with heart rate around 105, respiratory rate is 28, blood pressure 137/95, satting at 95%.  No leukocytosis with a white blood cell count of 7.3, hemoglobin 11.7 Chemistry panel reveals a slight hyponatremia at 133, is otherwise normal CT scan shows small bowel obstruction pattern.  Significantly dilated loops of small bowel with fecalized internal contents.  Potential several regions of small bowel transition which would suggest occasions.  New free fluid along the bare space of the liver.  Inflammation through the rectosigmoid colon similar to prior exam EKG shows a heart rate of 103, sinus tachycardia, QTC 4 and 66 with right bundle branch block Gen surg consulted from ED and rec'd admission as well as NG tube - not likely a surgical candidate NG tube placed in the ED   HOSPITAL COURSE BY PROBLEM  Recurrent SBO -  Resolved now.  Pt responded to NG tube bowel rest and supportive measures.  Surgery ordered SBFT and that seemed to relieve the obstruction.  After palliative discussions, decision was made to pursue [facility] [place] [facility].  Do not rehospitalize.  No surgery and no further NG placement.  Appreciate surgery recommendations.  DC home with hospice today.    Essential hypertension - DC BP meds as her BP readings have been soft.   HLD - resume home med at discharge.   Hypothyrodism - stable on home levothyroxine   Fall at  home - pt is going home with hospice care     DNR present on admission - continue DNR order in hospital.   DVT prophylaxis: SCD/heparin Code Status: DNR Family Communication: daughter at bedside Disposition: home with hospice   Discharge Diagnoses:  Principal Problem:   SBO (small bowel obstruction) ([place]) Active Problems:   Hypercholesterolemia   Hypertension   Thyroid disease   Small bowel obstruction [facility])  Discharge Instructions:  Allergies as of 03/23/2021   No Known Allergies      Medication List     STOP taking these medications    losartan 100 MG tablet Commonly known as: COZAAR       TAKE these medications    allopurinol 300 MG tablet Commonly known as: ZYLOPRIM Take 300 mg by mouth daily.   atorvastatin 40 MG tablet Commonly known as: LIPITOR Take 20 mg by mouth daily.   Euthyrox 137 MCG tablet Generic drug: levothyroxine Take 137 mcg by mouth daily.   FeroSul 325 (65 FE) MG tablet Generic drug: ferrous sulfate Take 325 mg by mouth daily.   nitroGLYCERIN 0.4 MG SL tablet Commonly known as: NITROSTAT Place 0.4 mg under the tongue every 5 (five) minutes as needed for chest pain.   pantoprazole 40 MG tablet Commonly known as: PROTONIX Take 1 tablet by mouth daily.        No Known Allergies Allergies as of 03/23/2021   No Known Allergies      Medication List     STOP taking these medications    losartan 100 MG tablet Commonly known as: COZAAR       TAKE these medications    allopurinol 300 MG tablet Commonly known as: ZYLOPRIM Take 300 mg by mouth daily.   atorvastatin 40 MG tablet Commonly known as: LIPITOR Take 20 mg by mouth daily.   Euthyrox 137 MCG tablet Generic drug: levothyroxine Take 137 mcg by mouth daily.   FeroSul 325 (65 FE) MG tablet Generic drug: ferrous sulfate Take 325 mg by mouth daily.   nitroGLYCERIN 0.4 MG SL tablet Commonly known as: NITROSTAT Place 0.4 mg under the tongue every 5 (five)  minutes as needed for chest pain.   pantoprazole 40 MG tablet Commonly known as: PROTONIX Take 1 tablet by mouth daily.        Procedures/Studies: Abd 1 View (KUB)  Result Date: 03/21/2021 CLINICAL DATA:  NG tube.  Shortness of breath. EXAM: ABDOMEN - 1 VIEW COMPARISON:  Chest x-ray 03/20/2021.  CT 03/20/2021. FINDINGS: NG tube has been advanced. Its tip is coiled in the stomach. Persistent prominently dilated loops of bowel again noted. No free air identified. Contrast from prior CT noted the bladder. Visceral atherosclerotic vascular calcification. Degenerative changes and scoliosis lumbar spine. Degenerative changes both hips. IMPRESSION: NG tube has been advanced. Its tip is coiled in stomach. Persistent prominent dilated loops of bowel again noted Electronically Signed   By: Marcello Moores  Register   On: 03/21/2021 06:20   CT Abdomen Pelvis W Contrast  Result Date: 03/20/2021 CLINICAL DATA:  Small bowel obstruction. EXAM: CT ABDOMEN AND PELVIS WITH CONTRAST TECHNIQUE: Multidetector CT imaging of the abdomen and pelvis was performed using the standard protocol following bolus administration of intravenous contrast. CONTRAST:  110mL OMNIPAQUE IOHEXOL 300 MG/ML  SOLN COMPARISON:  CT 02/27/2021 FINDINGS: Lower chest: Lung bases are clear. Hepatobiliary: No focal hepatic lesion. No biliary duct dilatation. Common bile duct is normal. New free fluid over the paraspinal liver (image 17/2. Pancreas: Pancreas is normal. No ductal dilatation. No pancreatic inflammation. Spleen: Normal spleen Adrenals/urinary tract: Adrenal glands and kidneys are normal. The ureters and bladder normal. Stomach/Bowel: Small hiatal hernia. Stomach appears normal. Duodenum is normal. Most proximal small bowel is normal. The mid ileum is significantly dilated with fecalized internal contents. For example the mid small bowel measure up to 4.2 cm on image 44/2. There are long segments of distended small bowel. There is an apparent  transition point noted on coronal image 37/5 with transition from stool-filled small bowel to decompression. There is more distal dilatation downstream of this transition which suggest multifocal process. The small bowel is collapsed leading up to the colon. The colon is predominantly collapsed. Mild inflammation through the sigmoid colon rectum (image 62/2. Vascular/Lymphatic: Abdominal aorta is normal caliber. No periportal or retroperitoneal adenopathy. No pelvic adenopathy. Reproductive: Post hysterectomy Other: No intraperitoneal free air. Musculoskeletal: No aggressive osseous lesion. IMPRESSION: 1. Small bowel obstruction pattern. Significantly dilated loops of small bowel with fecalized internal contents. Potential several regions of small-bowel transition which would suggest adhesions. 2. No intraperitoneal free air, pneumatosis or portal venous gas. 3. New free fluid along the bare space of the liver. 4. Inflammation through the rectosigmoid colon similar comparison exam 5. Recommend surgical consultation small bowel obstruction. Electronically Signed   By: Suzy Bouchard M.D.   On: 03/20/2021 18:25   DG Chest Port 1 View  Result Date: 03/20/2021 CLINICAL DATA:  Check gastric catheter placement EXAM: PORTABLE CHEST 1 VIEW COMPARISON:  02/25/2021 FINDINGS: Gastric catheter is noted with the tip in the stomach. Proximal side port lies at the gastroesophageal junction. This should be advanced several cm deeper into the stomach. IMPRESSION: Gastric catheter as described. Further advancement into the stomach is recommended. Electronically Signed   By: Inez Catalina M.D.   On: 03/20/2021 21:09   DG Abd Portable 1V-Small Bowel Obstruction Protocol-initial, 8 hr delay  Result Date: 03/21/2021 CLINICAL DATA:  8 hour delay small bowel obstruction EXAM: PORTABLE ABDOMEN - 1 VIEW COMPARISON:  03/22/2019, CT 03/20/2021 FINDINGS: Esophageal tube tip overlies the proximal stomach. There is residual contrast  within the urinary bladder. Enteral contrast is present in the colon. Diffuse gas-filled central small bowel borderline dilated loops up to 3 cm. IMPRESSION: Enteral contrast is present in the colon. Mild air distension of central small bowel. Electronically Signed   By: Donavan Foil M.D.   On: 03/21/2021 19:50     Subjective: Pt says she feels well. No abdominal pain, no nausea or vomiting.  Agreeable to home hospice care and wants to go home.   Discharge Exam: Vitals:   03/22/21 2039 03/23/21 0431  BP: (!) 103/59 (!) 86/51  Pulse: 94 83  Resp: 18 18  Temp: 98.1 F (36.7 C) 97.8 F (36.6 C)  SpO2: 94% 97%   Vitals:   03/22/21 0550 03/22/21 1414 03/22/21 2039 03/23/21 0431  BP: 100/61 (!) 91/57 (!) 103/59 (!) 86/51  Pulse: 94 87 94 83  Resp: 18 18 18 18   Temp: 97.8 F (36.6 C) 97.9 F (36.6 C) 98.1 F (36.7 C) 97.8 F (36.6 C)  TempSrc: Oral Oral    SpO2: 95% 99% 94% 97%  Weight:      Height:       General: Pt is alert, awake, not in acute distress Cardiovascular: normal S1/S2 +, no rubs, no gallops Respiratory: CTA bilaterally, no wheezing, no rhonchi Abdominal: Soft, NT, ND, bowel sounds + Extremities: no edema, no cyanosis   The results of significant diagnostics from this hospitalization (including imaging, microbiology, ancillary and laboratory) are listed below for reference.     Microbiology: Recent Results (from the past 240 hour(s))  Resp Panel by RT-PCR (Flu A&B, Covid) Nasopharyngeal Swab     Status: None   Collection Time: 03/20/21  6:28 PM   Specimen: Nasopharyngeal Swab; Nasopharyngeal(NP) swabs in vial transport medium  Result Value Ref Range Status   SARS Coronavirus 2 by RT PCR NEGATIVE NEGATIVE Final    Comment: (NOTE) SARS-CoV-2 target nucleic acids are NOT DETECTED.  The SARS-CoV-2 RNA is generally detectable in upper respiratory specimens during the acute phase of infection. The lowest concentration of SARS-CoV-2 viral copies this assay can  detect is 138 copies/mL. A negative result does not preclude SARS-Cov-2 infection and should not be used as the sole basis for treatment or other patient management decisions. A negative result may occur with  improper specimen collection/handling, submission of specimen other than nasopharyngeal swab, presence of viral mutation(s) within the areas targeted by this assay, and inadequate number of viral copies(<138 copies/mL). A negative result must be combined with clinical observations, patient history, and epidemiological information. The expected result is Negative.  Fact Sheet for Patients:  EntrepreneurPulse.com.au  Fact Sheet for Healthcare Providers:  IncredibleEmployment.be  This test is no t yet approved or cleared by the [place] FDA and  has been authorized for detection and/or diagnosis of SARS-CoV-2 by FDA under an Emergency Use Authorization (EUA). This EUA will remain  in effect (meaning this test can be used) for the duration of the COVID-19 declaration under Section 564(b)(1) of the Act, 21 U.S.C.section 360bbb-3(b)(1), unless the authorization is terminated  or revoked sooner.       Influenza A by PCR NEGATIVE NEGATIVE Final   Influenza B by PCR NEGATIVE NEGATIVE Final    Comment: (NOTE) The Xpert Xpress SARS-CoV-2/FLU/RSV plus assay is intended as an aid in the diagnosis of influenza from Nasopharyngeal swab specimens and should not be used as a sole basis for treatment. Nasal washings and aspirates are unacceptable for Xpert Xpress SARS-CoV-2/FLU/RSV testing.  Fact Sheet for Patients: EntrepreneurPulse.com.au  Fact Sheet for Healthcare Providers: IncredibleEmployment.be  This test is not yet approved or cleared by the [place] FDA and has been authorized for detection and/or diagnosis of SARS-CoV-2 by FDA under an Emergency Use Authorization (EUA). This EUA will remain in  effect (meaning this test can be used) for the duration of the COVID-19 declaration under Section 564(b)(1) of the Act, 21 U.S.C. section 360bbb-3(b)(1), unless the authorization is terminated or revoked.  Performed at [facility], [address] [place], [place] [address]      Labs: BNP (last 3 results) No results for input(s): BNP in the last 8760 hours. Basic Metabolic Panel: Recent Labs  Lab 03/20/21 1549 03/21/21 0407  NA 133* 133*  K 4.4 4.5  CL 101 102  CO2 23 24  GLUCOSE 130* 128*  BUN 19 20  CREATININE 0.90 0.94  CALCIUM 8.9 8.3*  MG  --  1.9   Liver Function Tests: Recent Labs  Lab 03/20/21 1549 03/21/21 0407  AST 15 12*  ALT 11 9  ALKPHOS 108 91  BILITOT 0.7 0.5  PROT 6.0* 5.1*  ALBUMIN 3.2* 2.6*   No results for input(s): LIPASE, AMYLASE in the last 168 hours. No results for input(s): AMMONIA in the last 168 hours. CBC: Recent Labs  Lab 03/20/21 1549 03/21/21 0407  WBC 7.3 5.7  NEUTROABS 5.4 3.6  HGB 11.7* 10.2*  HCT 36.8 32.9*  MCV 92.5 93.2  PLT 321 297   Cardiac Enzymes: No results for input(s): CKTOTAL, CKMB, CKMBINDEX, TROPONINI in the last 168 hours. BNP: Invalid input(s): POCBNP CBG: No results for input(s): GLUCAP in the last 168 hours. D-Dimer No results for input(s): DDIMER in the last 72 hours. Hgb A1c No results for input(s): HGBA1C in the last 72 hours. Lipid Profile No results for input(s): CHOL, HDL, LDLCALC, TRIG, CHOLHDL, LDLDIRECT in the last 72 hours. Thyroid function studies Recent Labs    03/21/21 0407  TSH 10.905*   Anemia work up No results for input(s): VITAMINB12, FOLATE, FERRITIN, TIBC, IRON, RETICCTPCT in the last 72 hours. Urinalysis    Component Value Date/Time   COLORURINE YELLOW 03/20/2021 1830   APPEARANCEUR HAZY (A) 03/20/2021 1830   LABSPEC 1.010 03/20/2021 1830   PHURINE 5.0 03/20/2021 1830   GLUCOSEU NEGATIVE 03/20/2021 1830   HGBUR TRACE (A) 03/20/2021 1830   BILIRUBINUR NEGATIVE  03/20/2021 1830   KETONESUR NEGATIVE 03/20/2021 1830   PROTEINUR TRACE (A) 03/20/2021 1830   NITRITE POSITIVE (A) 03/20/2021 1830   LEUKOCYTESUR SMALL (A) 03/20/2021 1830   Sepsis Labs Invalid input(s): PROCALCITONIN,  WBC,  LACTICIDVEN Microbiology Recent Results (from the past 240 hour(s))  Resp Panel by RT-PCR (Flu A&B, Covid) Nasopharyngeal Swab     Status: None   Collection Time: 03/20/21  6:28 PM   Specimen: Nasopharyngeal Swab; Nasopharyngeal(NP) swabs in vial transport medium  Result Value Ref Range Status   SARS Coronavirus 2 by RT PCR NEGATIVE NEGATIVE Final    Comment: (NOTE) SARS-CoV-2 target nucleic acids are NOT DETECTED.  The SARS-CoV-2 RNA is generally detectable in upper respiratory specimens during the acute phase of infection. The lowest concentration of SARS-CoV-2 viral copies this assay can detect is 138 copies/mL. A negative result does not preclude SARS-Cov-2 infection and should not be used as the sole basis for treatment or other patient management decisions. A negative result may occur with  improper specimen collection/handling, submission of specimen other than nasopharyngeal swab, presence of viral mutation(s) within the areas targeted by this assay, and inadequate number of viral copies(<138 copies/mL). A negative result must be combined with clinical observations, patient history, and epidemiological information. The expected result is Negative.  Fact Sheet for Patients:  EntrepreneurPulse.com.au  Fact Sheet for Healthcare Providers:  IncredibleEmployment.be  This test is no t yet approved or cleared by the [place] FDA and  has been authorized for detection and/or diagnosis of SARS-CoV-2 by FDA under an Emergency Use Authorization (EUA). This EUA will remain  in effect (meaning this test can be used) for the duration of the COVID-19 declaration under Section 564(b)(1) of the Act, 21 U.S.C.section  360bbb-3(b)(1), unless the authorization is terminated  or revoked sooner.       Influenza A by PCR NEGATIVE NEGATIVE Final   Influenza B by PCR NEGATIVE NEGATIVE Final    Comment: (  NOTE) The Xpert Xpress SARS-CoV-2/FLU/RSV plus assay is intended as an aid in the diagnosis of influenza from Nasopharyngeal swab specimens and should not be used as a sole basis for treatment. Nasal washings and aspirates are unacceptable for Xpert Xpress SARS-CoV-2/FLU/RSV testing.  Fact Sheet for Patients: EntrepreneurPulse.com.au  Fact Sheet for Healthcare Providers: IncredibleEmployment.be  This test is not yet approved or cleared by the [place] FDA and has been authorized for detection and/or diagnosis of SARS-CoV-2 by FDA under an Emergency Use Authorization (EUA). This EUA will remain in effect (meaning this test can be used) for the duration of the COVID-19 declaration under Section 564(b)(1) of the Act, 21 U.S.C. section 360bbb-3(b)(1), unless the authorization is terminated or revoked.  Performed at [facility], [address] [place], [place] [address]    Time coordinating discharge: 38 mins   SIGNED:  Irwin Brakeman, MD  [facility] 03/23/2021, 9:51 AM How to contact the [facility] Attending or Consulting provider [place] or covering provider during after hours [place], for this patient?  Check the care team in [facility] and look for a) attending/consulting [facility] provider listed and b) the [facility] team listed Log into [email or web address] and use [place]'s universal password to access. If you do not have the password, please contact the hospital operator. Locate the [facility] provider you are looking for under [facility] and page to a number that you can be directly reached. If you still have difficulty reaching the provider, please page the [facility] (Director on Call) for the Hospitalists listed on amion for assistance.

## 2021-03-26 ENCOUNTER — Other Ambulatory Visit: Payer: Self-pay | Admitting: *Deleted

## 2021-03-26 NOTE — Patient Outreach (Signed)
[facility]) Care Management  03/26/2021  Courtney Oliver 11-Aug-1927 [id number]  Patient was rehospitalized for recurrent SBO and discharged home with Hospice. Talked with daughter today to ensure those services have been established and verified that no other needs from [facility]. Listened to and supported daughter.  Eulah Pont. Myrtie Neither, MSN, [facility] Gerontological Nurse Practitioner [facility] Care Management [phone]

## 2021-03-28 DIAGNOSIS — E7849 Other hyperlipidemia: Secondary | ICD-10-CM | POA: Diagnosis not present

## 2021-03-28 DIAGNOSIS — N1832 Chronic kidney disease, stage 3b: Secondary | ICD-10-CM | POA: Diagnosis not present

## 2021-03-28 DIAGNOSIS — E039 Hypothyroidism, unspecified: Secondary | ICD-10-CM | POA: Diagnosis not present

## 2021-03-28 DIAGNOSIS — I129 Hypertensive chronic kidney disease with stage 1 through stage 4 chronic kidney disease, or unspecified chronic kidney disease: Secondary | ICD-10-CM | POA: Diagnosis not present

## 2021-04-02 DIAGNOSIS — N189 Chronic kidney disease, unspecified: Secondary | ICD-10-CM | POA: Diagnosis not present

## 2021-04-02 DIAGNOSIS — D649 Anemia, unspecified: Secondary | ICD-10-CM | POA: Diagnosis not present

## 2021-04-02 DIAGNOSIS — K56609 Unspecified intestinal obstruction, unspecified as to partial versus complete obstruction: Secondary | ICD-10-CM | POA: Diagnosis not present

## 2021-04-02 DIAGNOSIS — Z85038 Personal history of other malignant neoplasm of large intestine: Secondary | ICD-10-CM | POA: Diagnosis not present

## 2021-04-02 DIAGNOSIS — E039 Hypothyroidism, unspecified: Secondary | ICD-10-CM | POA: Diagnosis not present

## 2021-04-16 ENCOUNTER — Ambulatory Visit: Payer: Medicare Other | Admitting: Urology

## 2021-04-17 DIAGNOSIS — R5383 Other fatigue: Secondary | ICD-10-CM | POA: Diagnosis not present

## 2021-04-17 DIAGNOSIS — E039 Hypothyroidism, unspecified: Secondary | ICD-10-CM | POA: Diagnosis not present

## 2021-04-19 DIAGNOSIS — K56609 Unspecified intestinal obstruction, unspecified as to partial versus complete obstruction: Secondary | ICD-10-CM | POA: Diagnosis not present

## 2021-04-19 DIAGNOSIS — N189 Chronic kidney disease, unspecified: Secondary | ICD-10-CM | POA: Diagnosis not present

## 2021-04-19 DIAGNOSIS — D649 Anemia, unspecified: Secondary | ICD-10-CM | POA: Diagnosis not present

## 2021-04-19 DIAGNOSIS — E7849 Other hyperlipidemia: Secondary | ICD-10-CM | POA: Diagnosis not present

## 2021-04-19 DIAGNOSIS — I10 Essential (primary) hypertension: Secondary | ICD-10-CM | POA: Diagnosis not present

## 2021-04-28 DIAGNOSIS — E039 Hypothyroidism, unspecified: Secondary | ICD-10-CM | POA: Diagnosis not present

## 2021-04-28 DIAGNOSIS — N1832 Chronic kidney disease, stage 3b: Secondary | ICD-10-CM | POA: Diagnosis not present

## 2021-04-28 DIAGNOSIS — E7849 Other hyperlipidemia: Secondary | ICD-10-CM | POA: Diagnosis not present

## 2021-04-28 DIAGNOSIS — I129 Hypertensive chronic kidney disease with stage 1 through stage 4 chronic kidney disease, or unspecified chronic kidney disease: Secondary | ICD-10-CM | POA: Diagnosis not present

## 2021-06-11 DIAGNOSIS — R7301 Impaired fasting glucose: Secondary | ICD-10-CM | POA: Diagnosis not present

## 2021-06-11 DIAGNOSIS — E039 Hypothyroidism, unspecified: Secondary | ICD-10-CM | POA: Diagnosis not present

## 2021-06-11 DIAGNOSIS — R413 Other amnesia: Secondary | ICD-10-CM | POA: Diagnosis not present

## 2021-06-11 DIAGNOSIS — E782 Mixed hyperlipidemia: Secondary | ICD-10-CM | POA: Diagnosis not present

## 2021-06-11 DIAGNOSIS — R2689 Other abnormalities of gait and mobility: Secondary | ICD-10-CM | POA: Diagnosis not present

## 2021-06-11 DIAGNOSIS — I10 Essential (primary) hypertension: Secondary | ICD-10-CM | POA: Diagnosis not present

## 2021-06-11 DIAGNOSIS — N189 Chronic kidney disease, unspecified: Secondary | ICD-10-CM | POA: Diagnosis not present

## 2021-06-11 DIAGNOSIS — Z6824 Body mass index (BMI) 24.0-24.9, adult: Secondary | ICD-10-CM | POA: Diagnosis not present

## 2023-04-04 IMAGING — CT CT ABD-PELV W/ CM
2 of 5 series · 16 of 46 positions shown, 18 images · IV contrast (Omnipaque or Isovue)
Comparison: CT 02/27/2021

CLINICAL DATA: Small bowel obstruction.

EXAM:
CT ABDOMEN AND PELVIS WITH CONTRAST
TECHNIQUE: Multidetector CT imaging of the abdomen and pelvis was performed
using the standard protocol following bolus administration of
intravenous contrast.
CONTRAST:  100mL OMNIPAQUE IOHEXOL 300 MG/ML  SOLN

[Series 2: axial st · axial · 0.82mm/px · z∈[-1051,-641]mm · 13 of 92 slices shown, 15 images]
[im 5/92  soft-tissue]
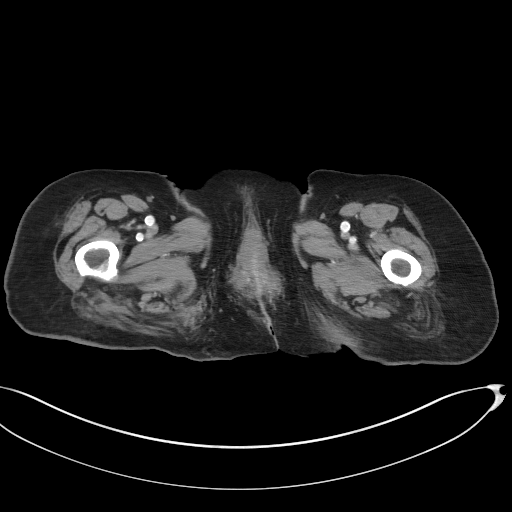
[im 5/92  bone]
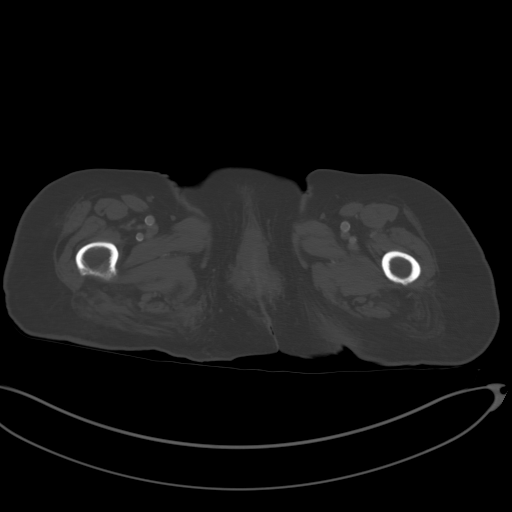
[im 15/92  soft-tissue]
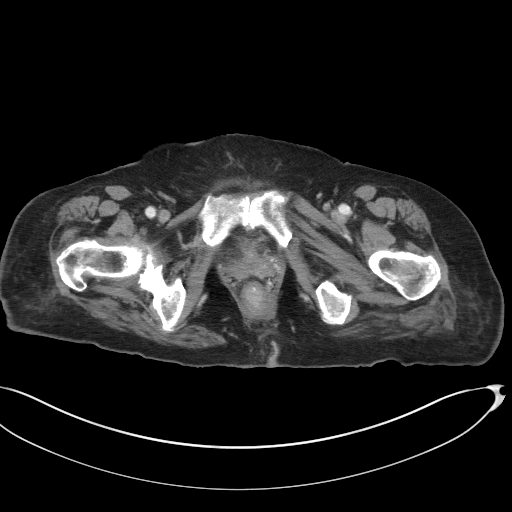
[im 20/92  soft-tissue]
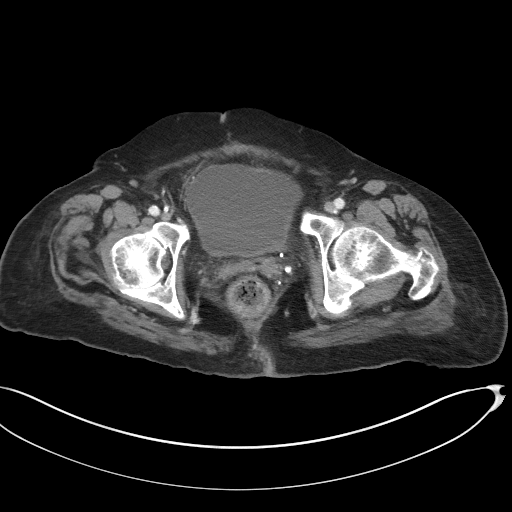
[im 24/92  soft-tissue]
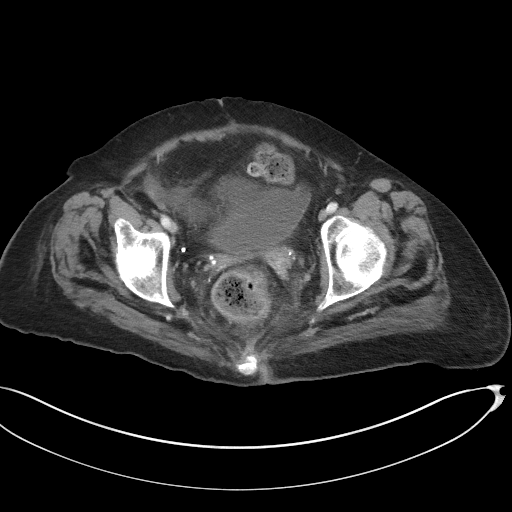
[im 34/92  soft-tissue]
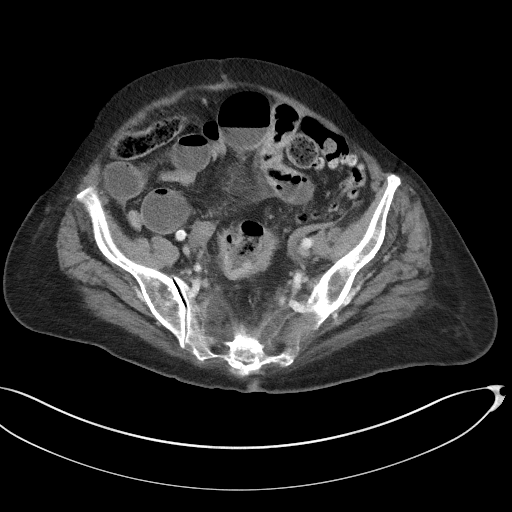
[im 39/92  soft-tissue]
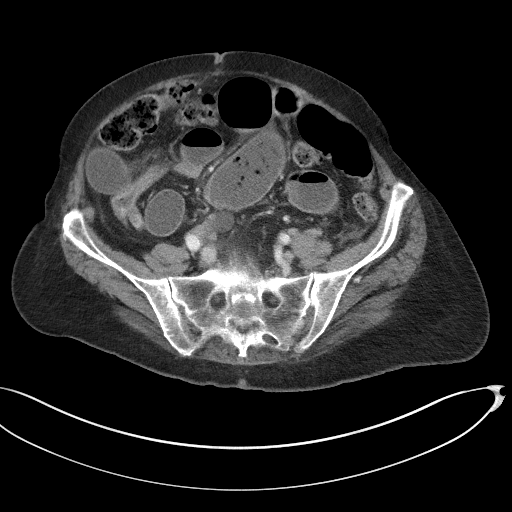
[im 48/92  soft-tissue]
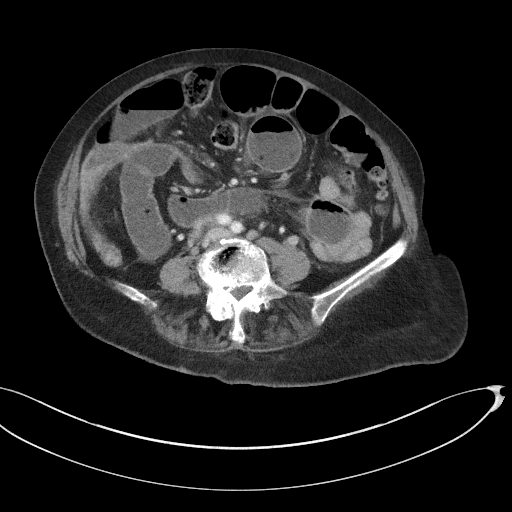
[im 53/92  soft-tissue]
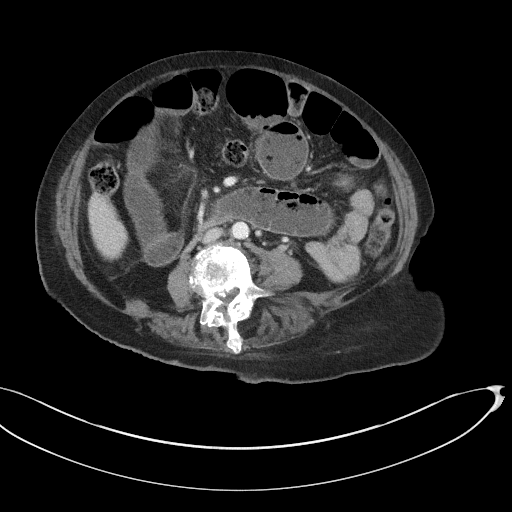
[im 58/92  soft-tissue]
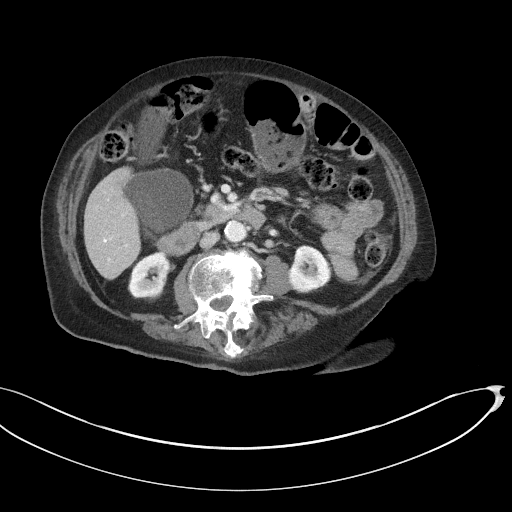
[im 58/92  bone]
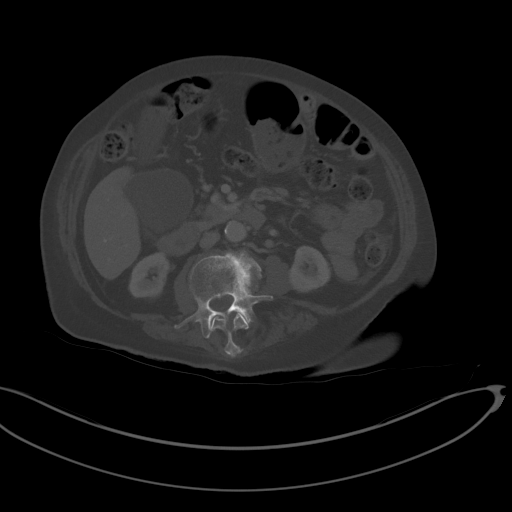
[im 68/92  soft-tissue]
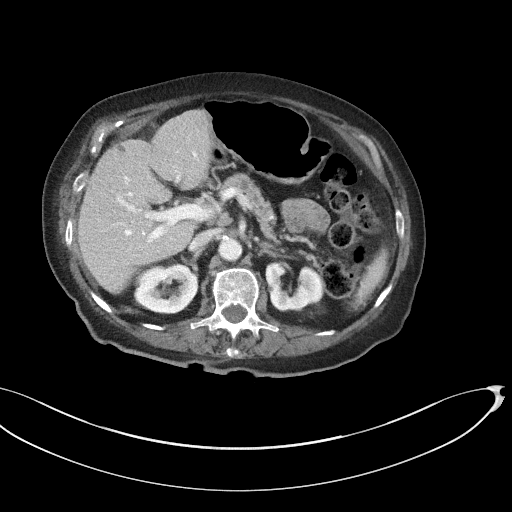
[im 72/92  soft-tissue]
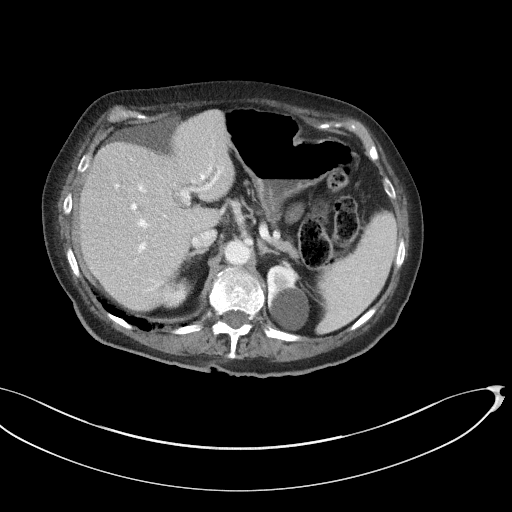
[im 77/92  soft-tissue]
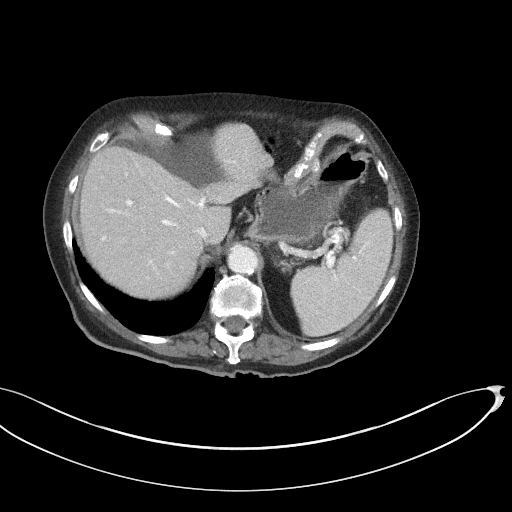
[im 87/92  soft-tissue]
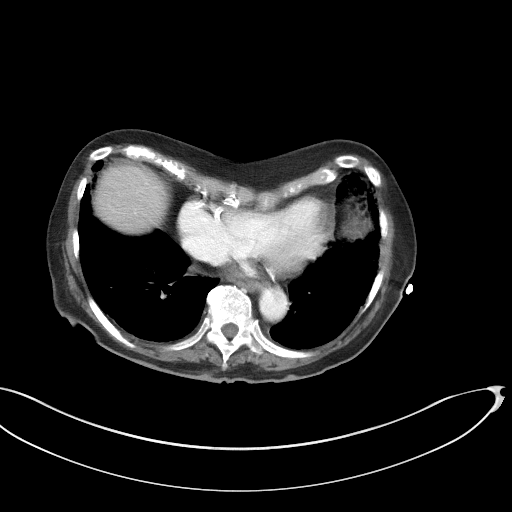

[Series 5: coronal st · coronal · 0.80mm/px · 3 of 106 slices shown]
[im 36/106  soft-tissue]
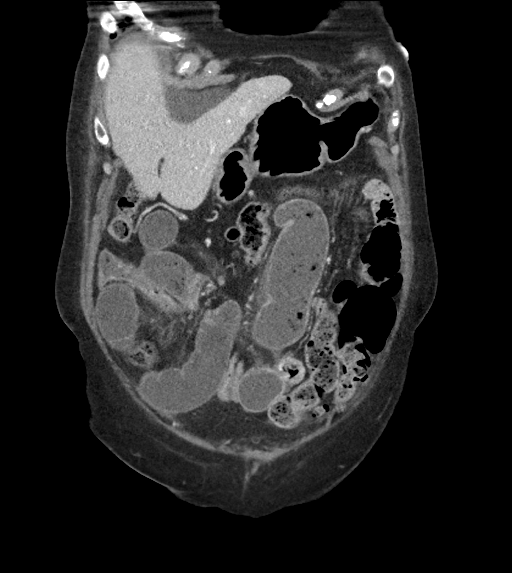
[im 47/106  soft-tissue]
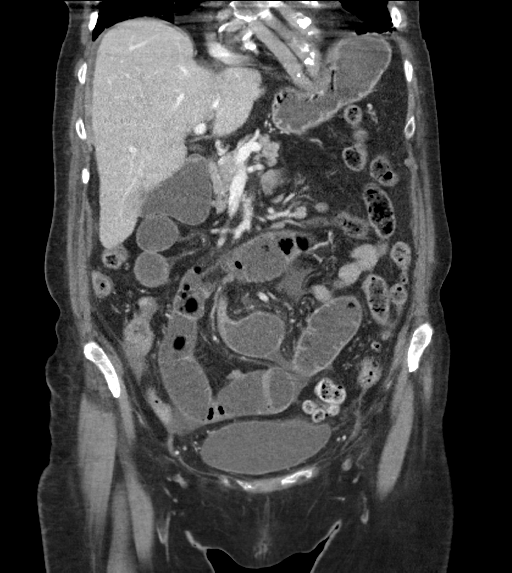
[im 59/106  soft-tissue]
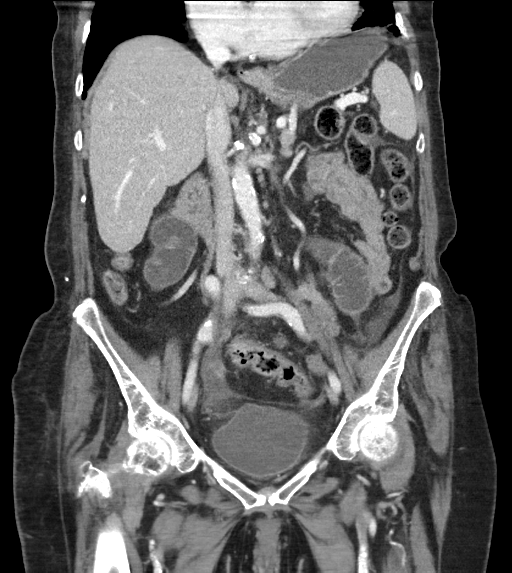

[16 of 46 positions shown; findings below may reference images not displayed]

FINDINGS: Lower chest: Lung bases are clear.

Hepatobiliary: No focal hepatic lesion. No biliary duct dilatation.
Common bile duct is normal. New free fluid over the paraspinal liver
(image [DATE].

Pancreas: Pancreas is normal. No ductal dilatation. No pancreatic
inflammation.

Spleen: Normal spleen

Adrenals/urinary tract: Adrenal glands and kidneys are normal. The
ureters and bladder normal.

Stomach/Bowel: Small hiatal hernia. Stomach appears normal. Duodenum
is normal. Most proximal small bowel is normal. The mid ileum is
significantly dilated with fecalized internal contents. For example
the mid small bowel measure up to 4.2 cm on image 44/2. There are
long segments of distended small bowel. There is an apparent
transition point noted on coronal image 37/5 with transition from
stool-filled small bowel to decompression. There is more distal
dilatation downstream of this transition which suggest multifocal
process.

The small bowel is collapsed leading up to the colon. The colon is
predominantly collapsed. Mild inflammation through the sigmoid colon
rectum (image 62/2.

Vascular/Lymphatic: Abdominal aorta is normal caliber. No periportal
or retroperitoneal adenopathy. No pelvic adenopathy.

Reproductive: Post hysterectomy

Other: No intraperitoneal free air.

Musculoskeletal: No aggressive osseous lesion.
IMPRESSION: 1. Small bowel obstruction pattern. Significantly dilated loops of
small bowel with fecalized internal contents. Potential several
regions of small-bowel transition which would suggest adhesions.
2. No intraperitoneal free air, pneumatosis or portal venous gas.
3. New free fluid along the bare space of the liver.
4. Inflammation through the rectosigmoid colon similar comparison
exam
5. Recommend surgical consultation small bowel obstruction.

## 2023-04-05 IMAGING — DX DG ABDOMEN 1V
2 series · 2 of 2 positions shown · non-contrast
Comparison: Chest x-ray 03/20/2021.  CT 03/20/2021.

CLINICAL DATA: NG tube.  Shortness of breath.

EXAM:
ABDOMEN - 1 VIEW

[abdomen supine (1 of 2)]
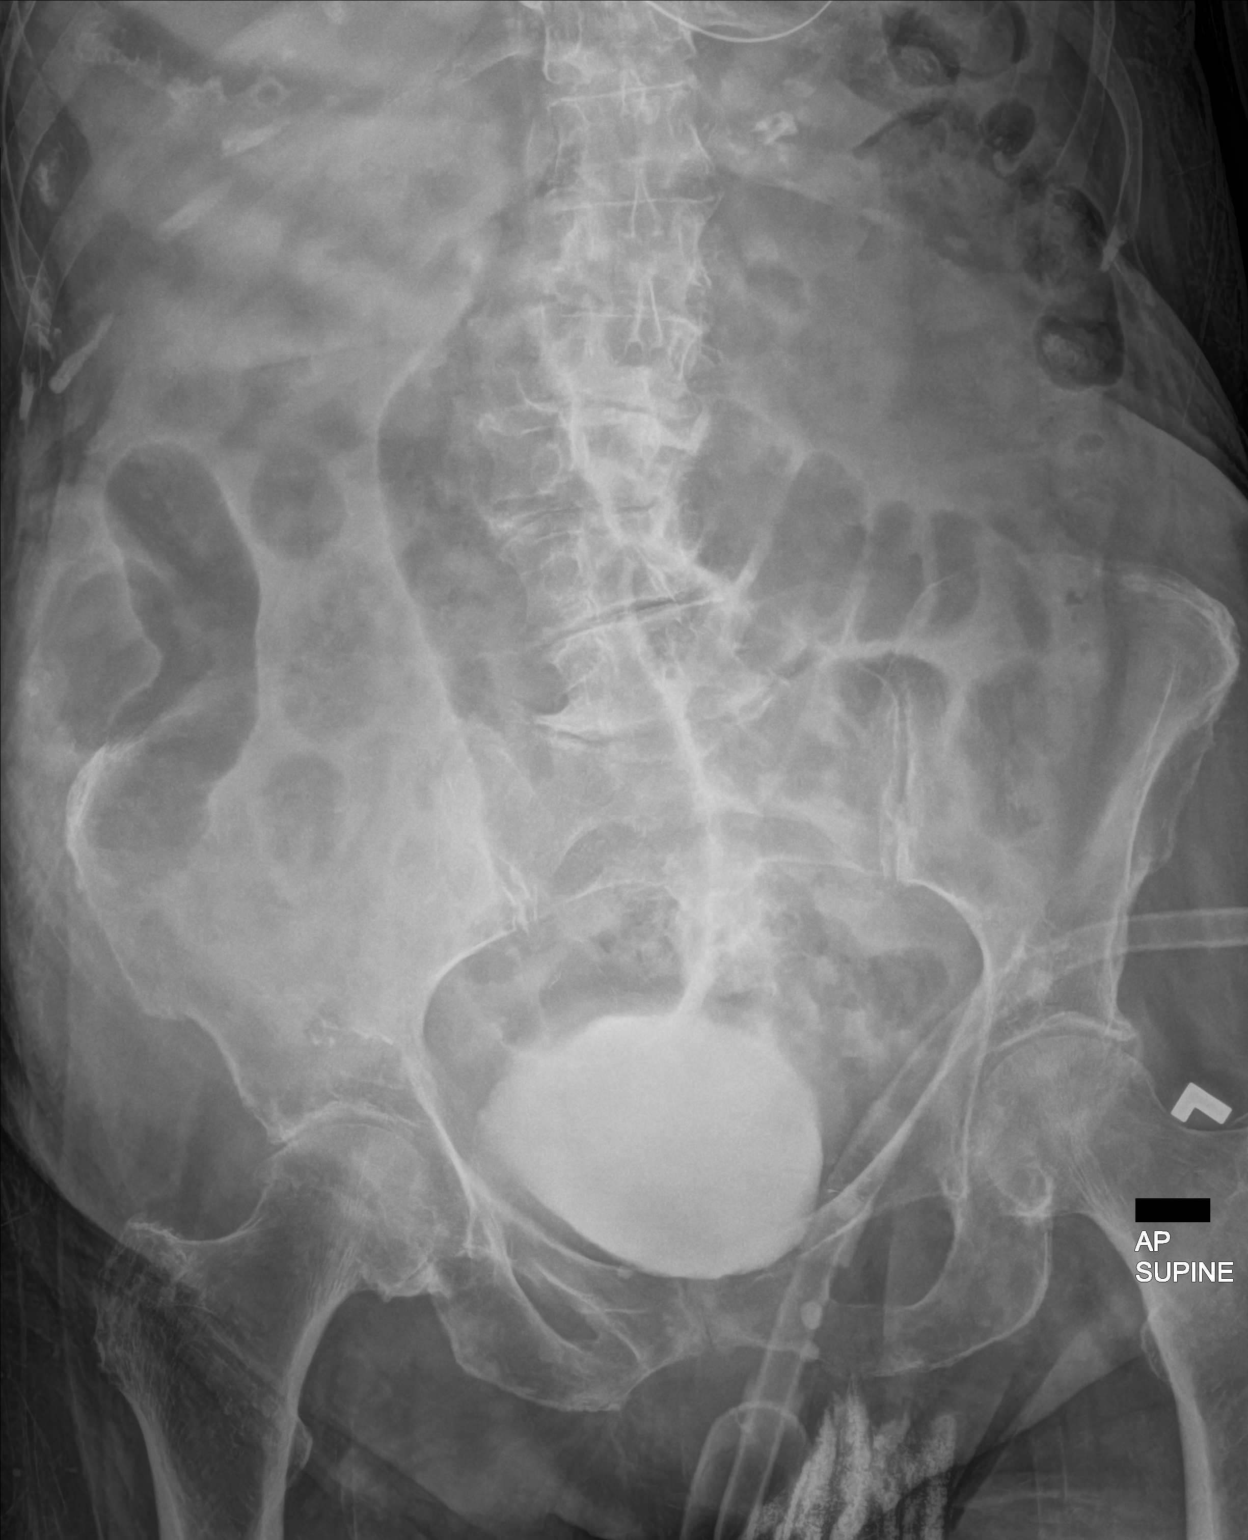

[abdomen supine (2 of 2)]
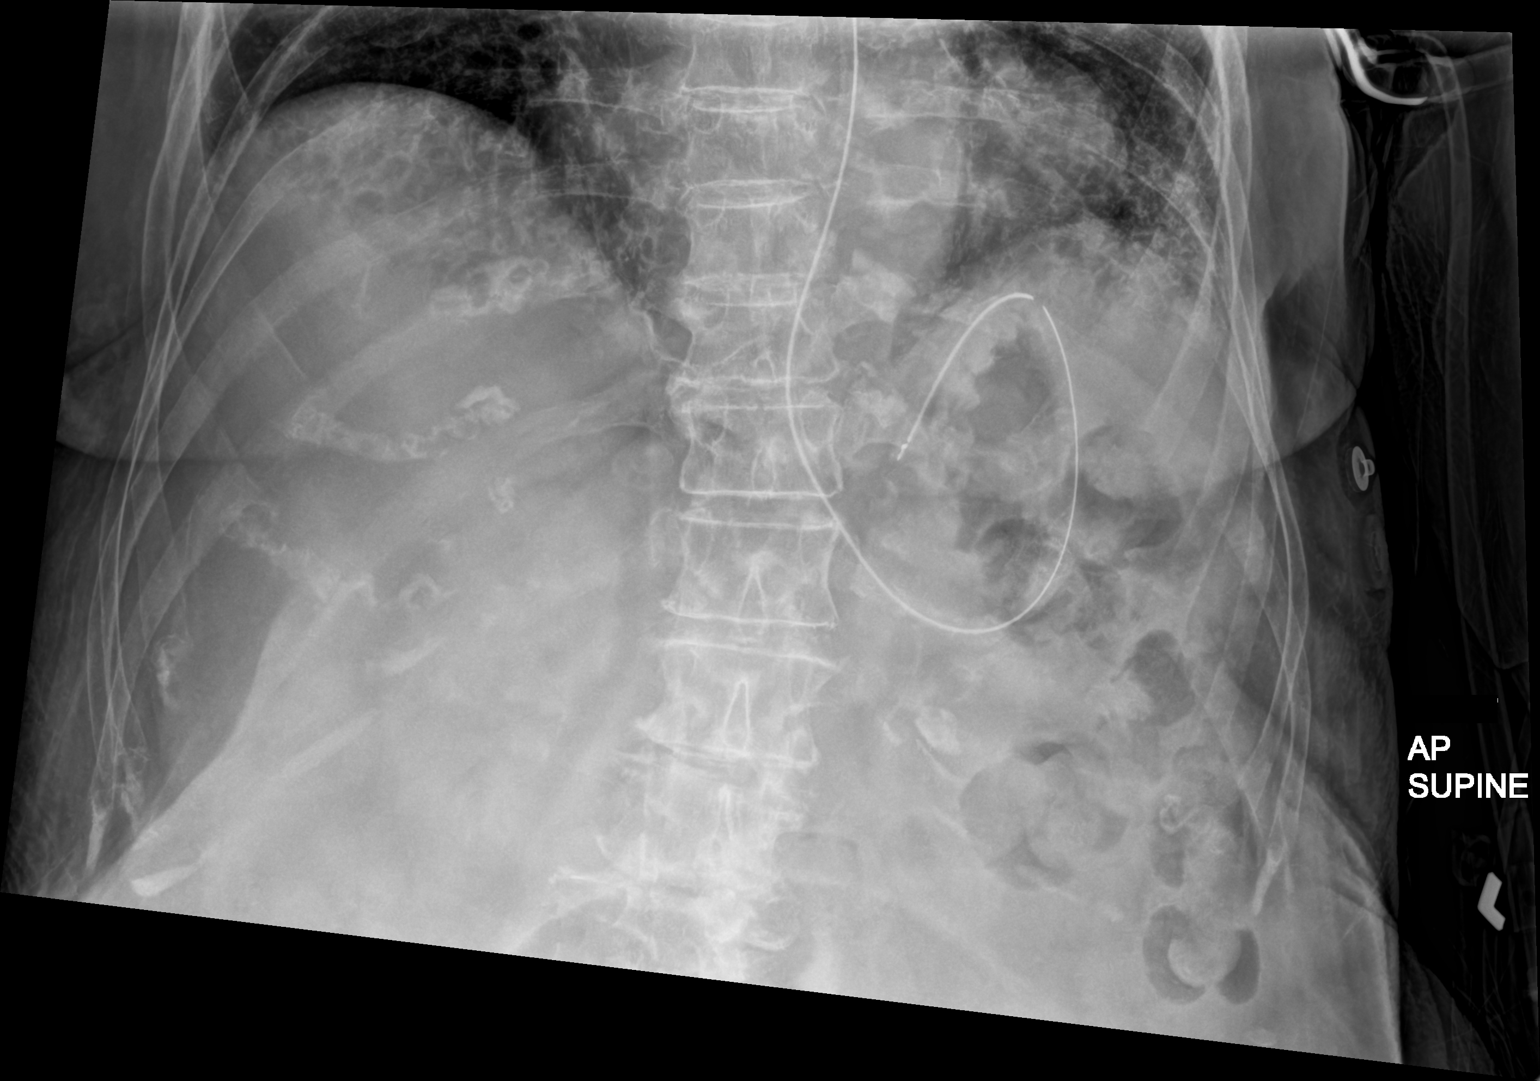

[2 of 2 positions shown; findings below may reference images not displayed]

FINDINGS: NG tube has been advanced. Its tip is coiled in the stomach.
Persistent prominently dilated loops of bowel again noted. No free
air identified. Contrast from prior CT noted the bladder. Visceral
atherosclerotic vascular calcification. Degenerative changes and
scoliosis lumbar spine. Degenerative changes both hips.
IMPRESSION: NG tube has been advanced. Its tip is coiled in stomach. Persistent
prominent dilated loops of bowel again noted

## 2023-04-05 IMAGING — DX DG ABD PORTABLE 1V
2 series · 2 of 2 positions shown · non-contrast
Comparison: 03/22/2019, CT 03/20/2021

CLINICAL DATA: 8 hour delay small bowel obstruction

EXAM:
PORTABLE ABDOMEN - 1 VIEW

[abdomen supine (1 of 2)]
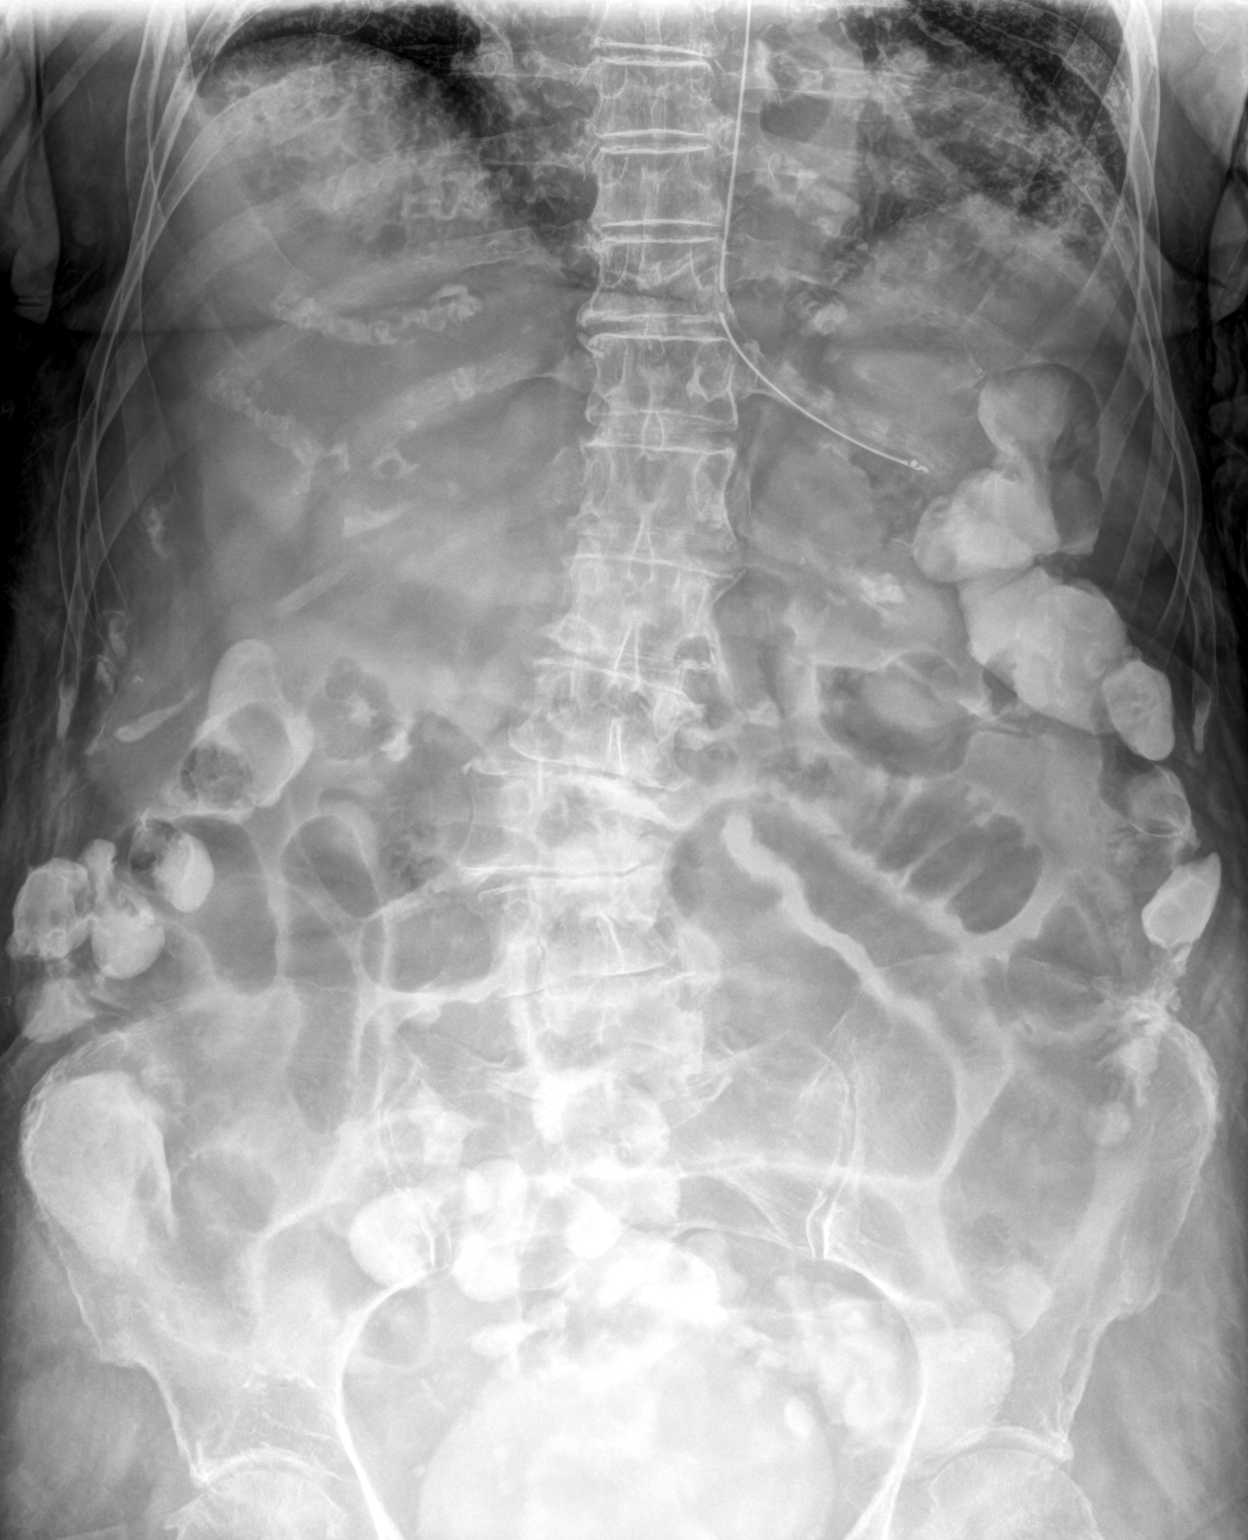

[abdomen supine (2 of 2)]
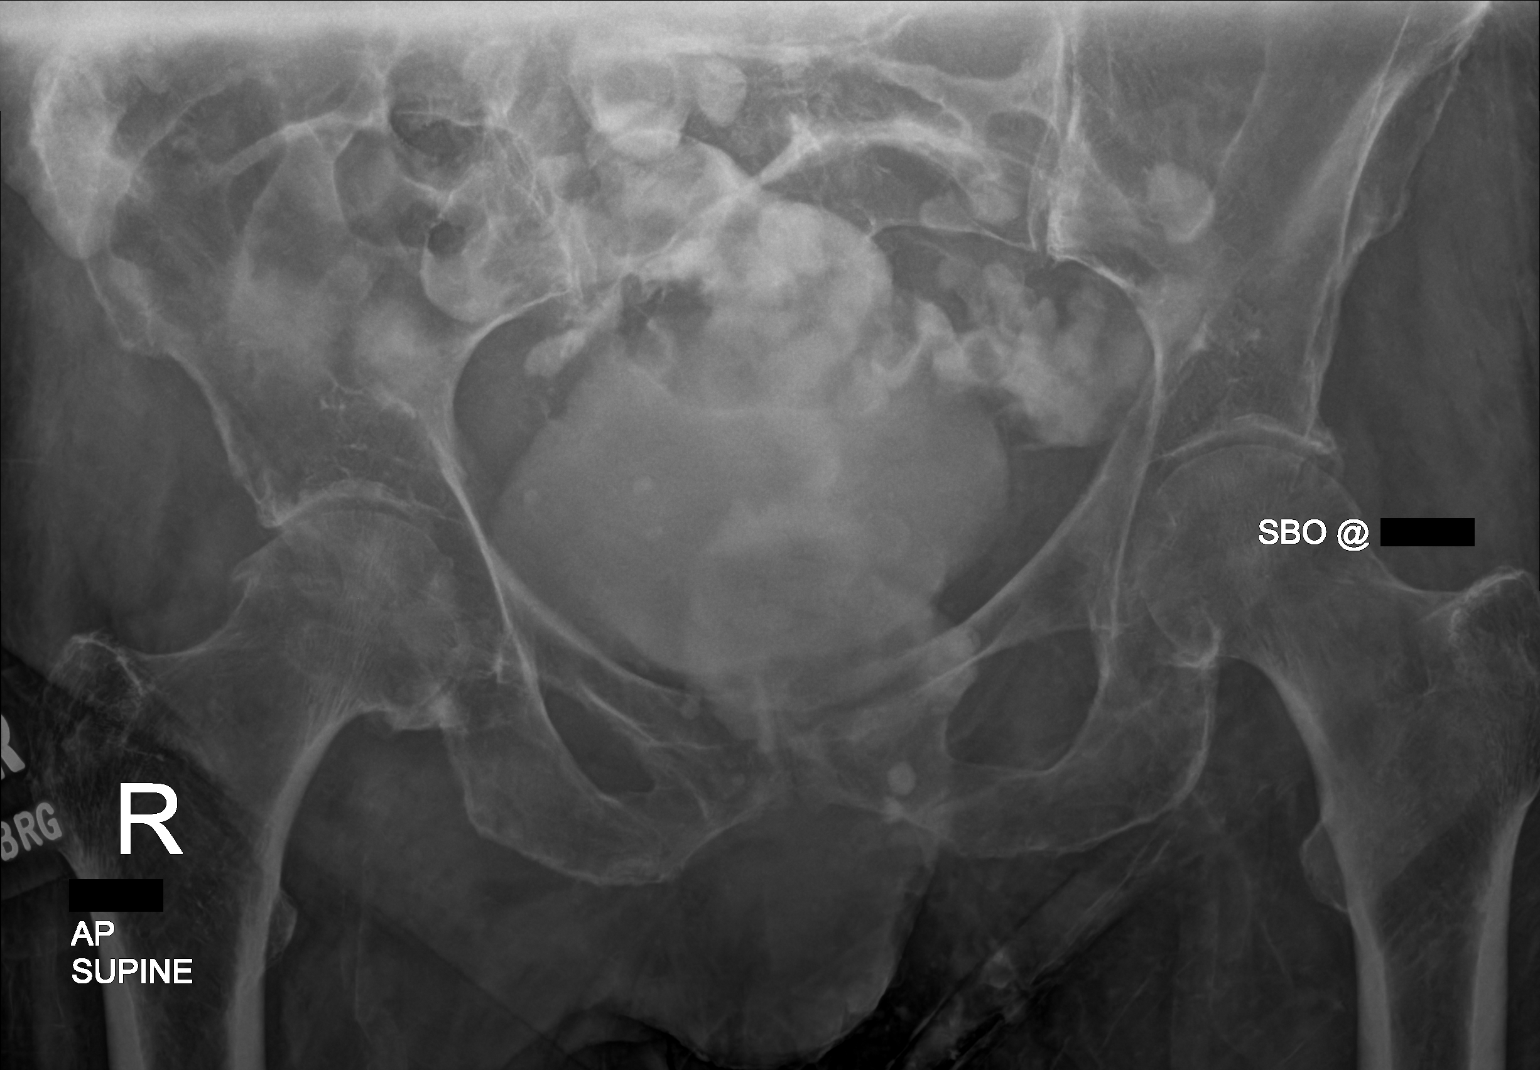

[2 of 2 positions shown; findings below may reference images not displayed]

FINDINGS: Esophageal tube tip overlies the proximal stomach. There is residual
contrast within the urinary bladder. Enteral contrast is present in
the colon. Diffuse gas-filled central small bowel borderline dilated
loops up to 3 cm.
IMPRESSION: Enteral contrast is present in the colon. Mild air distension of
central small bowel.
